# Patient Record
Sex: Male | Born: 1950 | Hispanic: No | Marital: Married | Smoking: Former smoker
Health system: Southern US, Community
[De-identification: ages and names within clinical notes are randomized; demographics above are authoritative.]

## PROBLEM LIST (undated history)

## (undated) DIAGNOSIS — K219 Gastro-esophageal reflux disease without esophagitis: Secondary | ICD-10-CM

## (undated) DIAGNOSIS — I10 Essential (primary) hypertension: Secondary | ICD-10-CM

## (undated) HISTORY — PX: ANAL FISTULOTOMY: SHX6423

## (undated) HISTORY — PX: OTHER SURGICAL HISTORY: SHX169

## (undated) HISTORY — PX: PROCTOSIGMOIDOSCOPY: SUR1052

## (undated) HISTORY — PX: FEMUR SURGERY: SHX943

---

## 2009-05-04 DIAGNOSIS — C4492 Squamous cell carcinoma of skin, unspecified: Secondary | ICD-10-CM

## 2009-05-04 HISTORY — DX: Squamous cell carcinoma of skin, unspecified: C44.92

## 2009-08-29 DIAGNOSIS — C4491 Basal cell carcinoma of skin, unspecified: Secondary | ICD-10-CM

## 2009-08-29 HISTORY — DX: Basal cell carcinoma of skin, unspecified: C44.91

## 2020-05-09 ENCOUNTER — Telehealth: Payer: Self-pay | Admitting: *Deleted

## 2020-05-09 DIAGNOSIS — Z122 Encounter for screening for malignant neoplasm of respiratory organs: Secondary | ICD-10-CM

## 2020-05-09 DIAGNOSIS — Z87891 Personal history of nicotine dependence: Secondary | ICD-10-CM

## 2020-05-09 NOTE — Telephone Encounter (Signed)
Received referral for initial lung cancer screening scan. Contacted patient and obtained smoking history,(former, quit 10/2012, 46 pack year) as well as answering questions related to screening process. Patient denies signs of lung cancer such as weight loss or hemoptysis. Patient denies comorbidity that would prevent curative treatment if lung cancer were found. Patient is scheduled for shared decision making visit and CT scan on 05/15/20 at 145pm.

## 2020-05-15 ENCOUNTER — Other Ambulatory Visit: Payer: Self-pay

## 2020-05-15 ENCOUNTER — Encounter: Payer: Self-pay | Admitting: Nurse Practitioner

## 2020-05-15 ENCOUNTER — Inpatient Hospital Stay: Payer: Medicare Other | Attending: Nurse Practitioner | Admitting: Nurse Practitioner

## 2020-05-15 ENCOUNTER — Ambulatory Visit
Admission: RE | Admit: 2020-05-15 | Discharge: 2020-05-15 | Disposition: A | Discharge status: Home / Self Care | Payer: Medicare Other | Source: Ambulatory Visit | Attending: Nurse Practitioner | Admitting: Nurse Practitioner

## 2020-05-15 DIAGNOSIS — Z87891 Personal history of nicotine dependence: Secondary | ICD-10-CM

## 2020-05-15 DIAGNOSIS — Z122 Encounter for screening for malignant neoplasm of respiratory organs: Secondary | ICD-10-CM

## 2020-05-15 NOTE — Progress Notes (Signed)
Virtual Visit via Video Enabled Telemedicine Note   I connected with Keith Barrera on 05/15/20 at 2:00 PM EST by video enabled telemedicine visit and verified that I am speaking with the correct person using two identifiers.   I discussed the limitations, risks, security and privacy concerns of performing an evaluation and management service by telemedicine and the availability of in-person appointments. I also discussed with the patient that there may be a patient responsible charge related to this service. The patient expressed understanding and agreed to proceed.   Other persons participating in the visit and their role in the encounter: Burgess Estelle, RN- checking in patient & navigation  Patient's location: [place]  Provider's location: Clinic  Chief Complaint: Low Dose CT Screening  Patient agreed to evaluation by telemedicine to discuss shared decision making for consideration of low dose CT lung cancer screening.    In accordance with CMS guidelines, patient has met eligibility criteria including age, absence of signs or symptoms of lung cancer.  Social History   Tobacco Use  . Smoking status: Former Smoker    Packs/day: 1.00    Years: 46.00    Pack years: 46.00    Types: Cigarettes    Quit date: 10/2012    Years since quitting: 7.5  Substance Use Topics  . Alcohol use: Not on file     A shared decision-making session was conducted prior to the performance of CT scan. This includes one or more decision aids, includes benefits and harms of screening, follow-up diagnostic testing, over-diagnosis, false positive rate, and total radiation exposure.   Counseling on the importance of adherence to annual lung cancer LDCT screening, impact of co-morbidities, and ability or willingness to undergo diagnosis and treatment is imperative for compliance of the program.   Counseling on the importance of continued smoking cessation for former smokers; the importance of smoking  cessation for current smokers, and information about tobacco cessation interventions have been given to patient including [place] and [facility] [place] programs.   Written order for lung cancer screening with LDCT has been given to the patient and any and all questions have been answered to the best of my abilities.    Yearly follow up will be coordinated by Burgess Estelle, Thoracic Navigator.  I discussed the assessment and treatment plan with the patient. The patient was provided an opportunity to ask questions and all were answered. The patient agreed with the plan and demonstrated an understanding of the instructions.   The patient was advised to call back or seek an in-person evaluation if the symptoms worsen or if the condition fails to improve as anticipated.   I provided 15 minutes of face-to-face video visit time during this encounter, and > 50% was spent counseling as documented under my assessment & plan.   Beckey Rutter, DNP, AGNP-C [place] at [facility] [phone] (clinic)

## 2020-05-18 ENCOUNTER — Encounter: Payer: Self-pay | Admitting: *Deleted

## 2020-10-09 ENCOUNTER — Other Ambulatory Visit: Payer: Self-pay

## 2020-10-09 ENCOUNTER — Ambulatory Visit (INDEPENDENT_AMBULATORY_CARE_PROVIDER_SITE_OTHER): Payer: Medicare Other | Admitting: Dermatology

## 2020-10-09 DIAGNOSIS — L820 Inflamed seborrheic keratosis: Secondary | ICD-10-CM | POA: Diagnosis not present

## 2020-10-09 DIAGNOSIS — D1800 Hemangioma unspecified site: Secondary | ICD-10-CM

## 2020-10-09 DIAGNOSIS — Z85828 Personal history of other malignant neoplasm of skin: Secondary | ICD-10-CM

## 2020-10-09 DIAGNOSIS — L219 Seborrheic dermatitis, unspecified: Secondary | ICD-10-CM | POA: Diagnosis not present

## 2020-10-09 DIAGNOSIS — L821 Other seborrheic keratosis: Secondary | ICD-10-CM

## 2020-10-09 DIAGNOSIS — C44212 Basal cell carcinoma of skin of right ear and external auricular canal: Secondary | ICD-10-CM | POA: Diagnosis not present

## 2020-10-09 DIAGNOSIS — Z1283 Encounter for screening for malignant neoplasm of skin: Secondary | ICD-10-CM

## 2020-10-09 DIAGNOSIS — L570 Actinic keratosis: Secondary | ICD-10-CM | POA: Diagnosis not present

## 2020-10-09 DIAGNOSIS — L814 Other melanin hyperpigmentation: Secondary | ICD-10-CM

## 2020-10-09 DIAGNOSIS — L308 Other specified dermatitis: Secondary | ICD-10-CM

## 2020-10-09 DIAGNOSIS — D229 Melanocytic nevi, unspecified: Secondary | ICD-10-CM

## 2020-10-09 DIAGNOSIS — D489 Neoplasm of uncertain behavior, unspecified: Secondary | ICD-10-CM

## 2020-10-09 DIAGNOSIS — L578 Other skin changes due to chronic exposure to nonionizing radiation: Secondary | ICD-10-CM

## 2020-10-09 MED ORDER — KETOCONAZOLE 2 % EX CREA: 1.0000 "application " | TOPICAL_CREAM | Freq: Two times a day (BID) | CUTANEOUS | 3 refills | Status: AC

## 2020-10-09 MED ORDER — CLOBETASOL PROPIONATE 0.05 % EX SOLN: 1.0000 "application " | Freq: Two times a day (BID) | CUTANEOUS | 1 refills | Status: DC

## 2020-10-09 NOTE — Progress Notes (Signed)
New Patient Visit  Subjective  Keith Barrera is a 70 y.o. male who presents for the following: New Patient (Initial Visit) (New patient here for initial visit and tbse. Patient states he has history of bcc on forehead. 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 Would like to discuss rash on chest and back since 2019, has a crusty spot on nose and right ear. Patient has history of dandruff. ).  Also itchy bump on back.  Patient here for full body skin exam and skin cancer screening. Patient has history of psoriasis.    Objective  Well appearing patient in no apparent distress; mood and affect are within normal limits.  A full examination was performed including scalp, head, eyes, ears, nose, lips, neck, chest, axillae, abdomen, back, buttocks, bilateral upper extremities, bilateral lower extremities, hands, feet, fingers, toes, fingernails, and toenails. All findings within normal limits unless otherwise noted below.  Objective  right anterior helix: 5 mm pink white flat papule      Objective  forehead x 9 , left ear helix x 1, (10): Erythematous thin papules/macules with gritty scale.   Objective  back, bilateral legs, bilateral arms: Scaly pink patches   Objective  left upper back x 1: Erythematous keratotic or waxy stuck-on papule   Objective  mid chest, perioccular area,: Bright pink patch with satellite papules lower sternum Pink scaly patches BL infraocular  Pink scaliness scalp  Assessment & Plan    Lentigines - Scattered tan macules - Discussed due to sun exposure - Benign, observe - Call for any changes  Seborrheic Keratoses - Stuck-on, waxy, tan-brown papules and plaques  - Discussed benign etiology and prognosis. - Observe - Call for any changes  Melanocytic Nevi - Tan-brown and/or pink-flesh-colored symmetric macules and papules - Benign appearing on exam today - Observation - Call clinic for new or changing moles - Recommend daily use of broad  spectrum spf 30+ sunscreen to sun-exposed areas.   Hemangiomas - Red papules - Discussed benign nature - Observe - Call for any changes  Actinic Damage On face  - Chronic, secondary to cumulative UV/sun exposure - diffuse scaly erythematous macules with underlying dyspigmentation - Recommend daily broad spectrum sunscreen SPF 30+ to sun-exposed areas, reapply every 2 hours as needed.  - Call for new or changing lesions.  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  - Call if any new or changing lesions are noted between office visits  History of Squamous Cell Carcinoma of the Skin - No evidence of recurrence today - No lymphadenopathy - Recommend regular full body skin exams - Recommend daily broad spectrum sunscreen SPF 30+ to sun-exposed areas, reapply every 2 hours as needed.  - Call if any new or changing lesions are noted between office visits  Skin cancer screening performed today.  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 Anesthesia: the lesion was anesthetized in a standard fashion   Anesthetic:  1% lidocaine w/ epinephrine 1-100,000 buffered w/ 8.4% NaHCO3 Instrument used: flexible razor blade   Hemostasis achieved with: pressure, aluminum chloride and electrodesiccation   Outcome: patient tolerated procedure well  Post-procedure details: wound care instructions given   Post-procedure details comment:  Ointment and small bandage applied  Specimen 1 - Surgical pathology Differential Diagnosis: r/o bcc  Check Margins: No 5 mm pink white flat papule  R/o bcc   Actinic keratosis  (10) forehead x 9 , left ear helix x 1,  Prior to procedure, discussed risks of blister formation, small wound, skin dyspigmentation, or rare scar following cryotherapy.  Destruction of lesion - forehead x 9 , left ear helix x 1,  Destruction method: cryotherapy   Informed consent: discussed and consent obtained   Lesion destroyed using liquid nitrogen: Yes   Region frozen until ice ball extended beyond lesion: Yes   Outcome: patient tolerated procedure well with no complications   Post-procedure details: wound care instructions given    Other eczema back, bilateral legs, bilateral arms  Nummular Dermatitis   Start Clobetasol mixed with cerave cream Eczema Skin Care  Buy TWO 16oz jars of CeraVe moisturizing cream  [facility], [facility], [facility] (no prescription needed)  Costs about $15 per jar   Jar #1: Use as a moisturizer as needed. Can be applied to any area of the body. Use twice daily to unaffected areas.  Jar #2: Pour one 56ml bottle of clobetasol 0.05% solution into jar, mix well. Label this jar to indicate the medication has been added. Use twice daily to affected areas. Do not apply to face, groin or underarms.  Moisturizer may burn or sting initially. Try for at least 4 weeks.  Ordered Medications: clobetasol (TEMOVATE) 0.05 % external solution  Inflamed seborrheic keratosis left upper back x 1  Prior to procedure, discussed risks of blister formation, small wound, skin dyspigmentation, or rare scar following cryotherapy.    Destruction of lesion - left upper back x 1  Destruction method: cryotherapy   Informed consent: discussed and consent obtained   Lesion destroyed using liquid nitrogen: Yes   Region frozen until ice ball extended beyond lesion: Yes   Outcome: patient tolerated procedure well with no complications   Post-procedure details: wound care instructions given    Seborrheic dermatitis mid chest, perioccular area,  Vs candida (chest)  Ketoconazole  2 % cream bid x 2-4 weeks. And prn flares H&S shampoo  Seborrheic Dermatitis  -  is a chronic persistent rash characterized by pinkness and scaling most commonly of the mid face but also can occur on the scalp (dandruff), ears; mid chest and mid back. It tends to be exacerbated by stress and cooler weather.  People who have neurologic disease may experience new onset or exacerbation of existing seborrheic dermatitis.  The condition is not curable but treatable and can be controlled.    Ordered Medications: ketoconazole (NIZORAL) 2 % cream  Return in about 6 weeks (around 11/20/2020) for rash follow up .  I, Ruthell Rummage, CMA, am acting as scribe for Brendolyn Patty, MD.  Documentation: I have reviewed the above documentation for accuracy and completeness, and I agree with the above.  Brendolyn Patty MD

## 2020-10-09 NOTE — Patient Instructions (Addendum)
Use for areas on back, legs and arms.   Clobetasol mixed with cerave cream Eczema Skin Care  Buy TWO 16oz jars of CeraVe moisturizing cream  [facility], [facility], [facility] (no prescription needed)  Costs about $15 per jar   Jar #1: Use as a moisturizer as needed. Can be applied to any area of the body. Use twice daily to unaffected areas.  Jar #2: Pour one 78ml bottle of clobetasol 0.05% solution into jar, mix well. Label this jar to indicate the medication has been added. Use twice daily to affected areas. Do not apply to face, groin or underarms.  Moisturizer may burn or sting initially. Try for at least 4 weeks.  Topical steroids (such as triamcinolone, fluocinolone, fluocinonide, mometasone, clobetasol, halobetasol, betamethasone, hydrocortisone) can cause thinning and lightening of the skin if they are used for too long in the same area. Your physician has selected the right strength medicine for your problem and area affected on the body. Please use your medication only as directed by your physician to prevent side effects.   Biopsy Wound Care Instructions  1. Leave the original bandage on for 24 hours if possible.  If the bandage becomes soaked or soiled before that time, it is OK to remove it and examine the wound.  A small amount of post-operative bleeding is normal.  If excessive bleeding occurs, remove the bandage, place gauze over the site and apply continuous pressure (no peeking) over the area for 30 minutes. If this does not work, please call our clinic as soon as possible or page your doctor if it is after hours.   2. Once a day, cleanse the wound with soap and water. It is fine to shower. If a thick crust develops you may use a Q-tip dipped into dilute hydrogen peroxide (mix 1:1 with water) to dissolve it.  Hydrogen peroxide can slow the healing process, so use it only as needed.    3. After washing, apply petroleum jelly (Vaseline) or an antibiotic ointment if your doctor prescribed  one for you, followed by a bandage.    4. For best healing, the wound should be covered with a layer of ointment at all times. If you are not able to keep the area covered with a bandage to hold the ointment in place, this may mean re-applying the ointment several times a day.  Continue this wound care until the wound has healed and is no longer open.   Itching and mild discomfort is normal during the healing process. However, if you develop pain or severe itching, please call our office.   If you have any discomfort, you can take Tylenol (acetaminophen) or ibuprofen as directed on the bottle. (Please do not take these if you have an allergy to them or cannot take them for another reason).  Some redness, tenderness and white or yellow material in the wound is normal healing.  If the area becomes very sore and red, or develops a thick yellow-green material (pus), it may be infected; please notify us.    If you have stitches, return to clinic as directed to have the stitches removed. You will continue wound care for 2-3 days after the stitches are removed.   Wound healing continues for up to one year following surgery. It is not unusual to experience pain in the scar from time to time during the interval.  If the pain becomes severe or the scar thickens, you should notify the office.    A slight amount of  redness in a scar is expected for the first six months.  After six months, the redness will fade and the scar will soften and fade.  The color difference becomes less noticeable with time.  If there are any problems, return for a post-op surgery check at your earliest convenience.  To improve the appearance of the scar, you can use silicone scar gel, cream, or sheets (such as Mederma or Serica) every night for up to one year. These are available over the counter (without a prescription).  Please call our office at [phone] for any questions or concerns.  Melanoma ABCDEs  Melanoma is the  most dangerous type of skin cancer, and is the leading cause of death from skin disease.  You are more likely to develop melanoma if you:  Have light-colored skin, light-colored eyes, or red or blond hair  Spend a lot of time in the sun  Tan regularly, either outdoors or in a tanning bed  Have had blistering sunburns, especially during childhood  Have a close family member who has had a melanoma  Have atypical moles or large birthmarks  Early detection of melanoma is key since treatment is typically straightforward and cure rates are extremely high if we catch it early.   The first sign of melanoma is often a change in a mole or a new dark spot.  The ABCDE system is a way of remembering the signs of melanoma.  A for asymmetry:  The two halves do not match. B for border:  The edges of the growth are irregular. C for color:  A mixture of colors are present instead of an even brown color. D for diameter:  Melanomas are usually (but not always) greater than 32mm - the size of a pencil eraser. E for evolution:  The spot keeps changing in size, shape, and color.  Please check your skin once per month between visits. You can use a small mirror in front and a large mirror behind you to keep an eye on the back side or your body.   If you see any new or changing lesions before your next follow-up, please call to schedule a visit.  Please continue daily skin protection including broad spectrum sunscreen SPF 30+ to sun-exposed areas, reapplying every 2 hours as needed when you're outdoors.    Cryotherapy Aftercare  . Wash gently with soap and water everyday.   Apply Vaseline and Band-Aid daily until healed.  Gentle Skin Care Guide  1. Bathe no more than once a day.  2. Avoid bathing in hot water  3. Use a mild soap like Dove, Vanicream, Cetaphil, CeraVe. Can use Lever 2000 or Cetaphil antibacterial soap  4. Use soap only where you need it. On most days, use it under your arms, between  your legs, and on your feet. Let the water rinse other areas unless visibly dirty.  5. When you get out of the bath/shower, use a towel to gently blot your skin dry, don't rub it.  6. While your skin is still a little damp, apply a moisturizing cream such as Vanicream, CeraVe, Cetaphil, Eucerin, Sarna lotion or plain Vaseline Jelly. For hands apply Neutrogena [place] Hand Cream or Excipial Hand Cream.  7. Reapply moisturizer any time you start to itch or feel dry.  8. Sometimes using free and clear laundry detergents can be helpful. Fabric softener sheets should be avoided. Downy Free & Gentle liquid, or any liquid fabric softener that is free of dyes and perfumes, it  acceptable to use  9. If your doctor has given you prescription creams you may apply moisturizers over them

## 2020-10-15 ENCOUNTER — Telehealth: Payer: Self-pay

## 2020-10-15 NOTE — Telephone Encounter (Signed)
Advised pt of bx results.  Scheduled pt for EDC/sh

## 2020-10-15 NOTE — Telephone Encounter (Signed)
-----   Message from Brendolyn Patty, MD sent at 10/13/2020  6:05 PM EST ----- Skin , right anterior helix BASAL CELL CARCINOMA, SUPERFICIAL, NODULAR AND INFILTRATIVE PATTERNS  BCC skin cancer- needs EDC

## 2020-10-31 ENCOUNTER — Other Ambulatory Visit: Payer: Self-pay

## 2020-10-31 ENCOUNTER — Ambulatory Visit (INDEPENDENT_AMBULATORY_CARE_PROVIDER_SITE_OTHER): Payer: Medicare Other | Admitting: Dermatology

## 2020-10-31 DIAGNOSIS — L219 Seborrheic dermatitis, unspecified: Secondary | ICD-10-CM | POA: Diagnosis not present

## 2020-10-31 DIAGNOSIS — L300 Nummular dermatitis: Secondary | ICD-10-CM | POA: Diagnosis not present

## 2020-10-31 DIAGNOSIS — L821 Other seborrheic keratosis: Secondary | ICD-10-CM

## 2020-10-31 DIAGNOSIS — C44212 Basal cell carcinoma of skin of right ear and external auricular canal: Secondary | ICD-10-CM | POA: Diagnosis not present

## 2020-10-31 NOTE — Progress Notes (Signed)
   Follow-Up Visit   Subjective  Keith Barrera is a 70 y.o. male who presents for the following: Basal Cell Carcinoma (Pt here for [facility] treatment for a biopsy proven BCC at the right anterior helix).  He also has questions about some of the cream treatments from his last visit.   The following portions of the chart were reviewed this encounter and updated as appropriate:      Review of Systems: No other skin or systemic complaints except as noted in HPI or Assessment and Plan.   Objective  Well appearing patient in no apparent distress; mood and affect are within normal limits.  A focused examination was performed including face, right ear, chest. Relevant physical exam findings are noted in the Assessment and Plan.  Objective  Right Anterior Helix: Biopsy proven BCC  Objective  Hairline and eyebrows: Pink scaly patches  Objective  right infraoccular: 5 mm pink flesh papule  Objective  right anterior axilla and lower sternal chest: Pink scaly patch  Assessment & Plan  Basal cell carcinoma (BCC) of skin of right ear Right Anterior Helix  Destruction of lesion  Destruction method: electrodesiccation and curettage   Informed consent: discussed and consent obtained   Timeout:  patient name, date of birth, surgical site, and procedure verified Procedure prep:  Patient was prepped and draped in usual sterile fashion Prep type:  Isopropyl alcohol Anesthesia: the lesion was anesthetized in a standard fashion   Anesthetic:  1% lidocaine w/ epinephrine 1-100,000 buffered w/ 8.4% NaHCO3 Curettage performed in three different directions: Yes   Electrodesiccation performed over the curetted area: Yes   Lesion length (cm):  0.5 Margin per side (cm):  0.2 Final wound size (cm):  0.9 Final wound size (cm) comment:  0.9 x 0.7 cm Hemostasis achieved with:  pressure, aluminum chloride and electrodesiccation Outcome: patient tolerated procedure well with no complications    Post-procedure details: wound care instructions given   Additional details:  Mupirocin ointment and Bandaid applied    Seborrheic dermatitis Hairline and eyebrows  Continue ketoconazole 2 % cream. Apply 1-2 times daily to affected areas.  Seborrheic Dermatitis  -  is a chronic persistent rash characterized by pinkness and scaling most commonly of the mid face but also can occur on the scalp (dandruff), ears; mid chest and mid back. It tends to be exacerbated by stress and cooler weather.  People who have neurologic disease may experience new onset or exacerbation of existing seborrheic dermatitis.  The condition is not curable but treatable and can be controlled.    Other Related Medications ketoconazole (NIZORAL) 2 % cream  Seborrheic keratosis right infraoccular  Benign-appearing.  Observation.  Call clinic for new or changing moles.  Recommend daily use of broad spectrum spf 30+ sunscreen to sun-exposed areas.   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   Once itchy rash clears, can switch to plain CeraVe cream.  Recommend mild soap and moisturizing cream 1-2 times daily.   . Return in 3 weeks (on 11/21/2020) for f/u as scheduled, may cancel if rash clears, then 6 months recheck ear.   I, Harriett Sine, CMA, am acting as scribe for Brendolyn Patty, MD.  Documentation: I have reviewed the above documentation for accuracy and completeness, and I agree with the above.  Brendolyn Patty MD

## 2020-10-31 NOTE — Patient Instructions (Signed)

## 2020-11-21 ENCOUNTER — Ambulatory Visit: Payer: Medicare Other | Admitting: Dermatology

## 2021-05-07 ENCOUNTER — Ambulatory Visit (INDEPENDENT_AMBULATORY_CARE_PROVIDER_SITE_OTHER): Payer: Medicare Other | Admitting: Dermatology

## 2021-05-07 ENCOUNTER — Other Ambulatory Visit: Payer: Self-pay

## 2021-05-07 DIAGNOSIS — Z85828 Personal history of other malignant neoplasm of skin: Secondary | ICD-10-CM | POA: Diagnosis not present

## 2021-05-07 DIAGNOSIS — L219 Seborrheic dermatitis, unspecified: Secondary | ICD-10-CM

## 2021-05-07 DIAGNOSIS — L821 Other seborrheic keratosis: Secondary | ICD-10-CM

## 2021-05-07 DIAGNOSIS — L820 Inflamed seborrheic keratosis: Secondary | ICD-10-CM

## 2021-05-07 DIAGNOSIS — Z1283 Encounter for screening for malignant neoplasm of skin: Secondary | ICD-10-CM

## 2021-05-07 DIAGNOSIS — L570 Actinic keratosis: Secondary | ICD-10-CM

## 2021-05-07 DIAGNOSIS — L578 Other skin changes due to chronic exposure to nonionizing radiation: Secondary | ICD-10-CM | POA: Diagnosis not present

## 2021-05-07 DIAGNOSIS — L281 Prurigo nodularis: Secondary | ICD-10-CM

## 2021-05-07 DIAGNOSIS — L300 Nummular dermatitis: Secondary | ICD-10-CM

## 2021-05-07 NOTE — Patient Instructions (Addendum)
Fluocinonide cream - Apply to itchy bump on neck below chin, spinal lower back, and left wrist 1-2 times a day until improved. Avoid face, groin, underarms.  Ketoconazole Cream - Apply to pink, flaky areas on face 1-2 times a day as needed.   Clobetasol/CeraVe mix - Apply to itchy rash on body 1-2 times a day as needed. Avoid face, groin, underarms.   Topical steroids (such as triamcinolone, fluocinolone, fluocinonide, mometasone, clobetasol, halobetasol, betamethasone, hydrocortisone) can cause thinning and lightening of the skin if they are used for too long in the same area. Your physician has selected the right strength medicine for your problem and area affected on the body. Please use your medication only as directed by your physician to prevent side effects.   Cryotherapy Aftercare  Wash gently with soap and water everyday.   Apply Vaseline and Band-Aid daily until healed.    If you have any questions or concerns for your doctor, please call our main line at [phone] and press option 4 to reach your doctor's medical assistant. If no one answers, please leave a voicemail as directed and we will return your call as soon as possible. Messages left after 4 pm will be answered the following business day.   You may also send [place] a message via [place]. We typically respond to MyChart messages within 1-2 business days.  For prescription refills, please ask your pharmacy to contact our office. Our fax number is [phone].  If you have an urgent issue when the clinic is closed that cannot wait until the next business day, you can page your doctor at the number below.    Please note that while we do our best to be available for urgent issues outside of office hours, we are not available 24/7.   If you have an urgent issue and are unable to reach [place], you may choose to seek medical care at your doctor's office, retail clinic, urgent care center, or emergency room.  If you have a medical  emergency, please immediately call 911 or go to the emergency department.  Pager Numbers  - Dr. Nehemiah Massed: [phone]  - Dr. Laurence Ferrari: [phone]  - Dr. Nicole Kindred: [phone]  In the event of inclement weather, please call our main line at [phone] for an update on the status of any delays or closures.  Dermatology Medication Tips: Please keep the boxes that topical medications come in in order to help keep track of the instructions about where and how to use these. Pharmacies typically print the medication instructions only on the boxes and not directly on the medication tubes.   If your medication is too expensive, please contact our office at [phone] option 4 or send [place] a message through [place].   We are unable to tell what your co-pay for medications will be in advance as this is different depending on your insurance coverage. However, we may be able to find a substitute medication at lower cost or fill out paperwork to get insurance to cover a needed medication.   If a prior authorization is required to get your medication covered by your insurance company, please allow [place] 1-2 business days to complete this process.  Drug prices often vary depending on where the prescription is filled and some pharmacies may offer cheaper prices.  The website www.goodrx.com contains coupons for medications through different pharmacies. The prices here do not account for what the cost may be with help from insurance (it may be cheaper with your insurance), but the  website can give you the price if you did not use any insurance.  - You can print the associated coupon and take it with your prescription to the pharmacy.  - You may also stop by our office during regular business hours and pick up a GoodRx coupon card.  - If you need your prescription sent electronically to a different pharmacy, notify our office through [facility] or by phone at [phone] option 4.

## 2021-05-07 NOTE — Progress Notes (Signed)
Follow-Up Visit   Subjective  Keith Barrera is a 70 y.o. male who presents for the following: Follow-up (Patient here for 6 month follow-up. He has a history of BCC of the right anterior helix that was treated 10/31/20. No new spots noticed since last visit. Also, recheck [facility] site of the right anterior helix, treated with [facility] 10/31/20.).  He has seb derm treated with ketoconazole cream and nummular derm treated with CeraVe/Clob mix, both under good control.   The following portions of the chart were reviewed this encounter and updated as appropriate:       Review of Systems:  No other skin or systemic complaints except as noted in HPI or Assessment and Plan.  Objective  Well appearing patient in no apparent distress; mood and affect are within normal limits.  A focused examination was performed including face, scalp, ears. Relevant physical exam findings are noted in the Assessment and Plan.  Right anterior helix Well healed scar with no evidence of recurrence.   ant neck/inf chin, L dorsal wrist, spinal lower back Light pink firm scaly papules.  R upper nasal dorsum x 1, R sideburn x 1, R malar cheek x 1, R lower cheek x 1, R frontal hairline x 1, L frontal hairline x 1 (6) Pink scaly macules.  face Pink patches with greasy scale.   Right Lateral Mid Back Erythematous keratotic or waxy stuck-on papule or plaque.   chest, ant axilla Clear today.   Assessment & Plan  Skin cancer screening performed today.  Actinic Damage - chronic, secondary to cumulative UV radiation exposure/sun exposure over time - diffuse scaly erythematous macules with underlying dyspigmentation - Recommend daily broad spectrum sunscreen SPF 30+ to sun-exposed areas, reapply every 2 hours as needed.  - Recommend staying in the shade or wearing long sleeves, sun glasses (UVA+UVB protection) and wide brim hats (4-inch brim around the entire circumference of the hat). - Call for new or changing  lesions.  History of Basal Cell Carcinoma of the Skin - No evidence of recurrence today - Recommend regular full body skin exams - Recommend daily broad spectrum sunscreen SPF 30+ to sun-exposed areas, reapply every 2 hours as needed.  - Call if any new or changing lesions are noted between office visits  History of Squamous Cell Carcinoma of the Skin - No evidence of recurrence today - Recommend regular full body skin exams - Recommend daily broad spectrum sunscreen SPF 30+ to sun-exposed areas, reapply every 2 hours as needed.  - Call if any new or changing lesions are noted between office visits  Seborrheic Keratoses - Stuck-on, waxy, tan-brown papules and/or plaques  - Benign-appearing - Discussed benign etiology and prognosis. - Observe - Call for any changes  History of basal cell carcinoma (BCC) Right anterior helix  Clear. Observe for recurrence. Call clinic for new or changing lesions.  Recommend regular skin exams, daily broad-spectrum spf 30+ sunscreen use, and photoprotection.    Prurigo nodularis ant neck/inf chin, L dorsal wrist, spinal lower back  Benign-appearing Start fluocinonide cream spot treat AA bid until improved. Pt has at home. Avoid face. Avoid picking.  Topical steroids (such as triamcinolone, fluocinolone, fluocinonide, mometasone, clobetasol, halobetasol, betamethasone, hydrocortisone) can cause thinning and lightening of the skin if they are used for too long in the same area. Your physician has selected the right strength medicine for your problem and area affected on the body. Please use your medication only as directed by your physician to prevent side effects.  AK (actinic keratosis) (6) R upper nasal dorsum x 1, R sideburn x 1, R malar cheek x 1, R lower cheek x 1, R frontal hairline x 1, L frontal hairline x 1  Actinic keratoses are precancerous spots that appear secondary to cumulative UV radiation exposure/sun exposure over time. They are  chronic with expected duration over 1 year. A portion of actinic keratoses will progress to squamous cell carcinoma of the skin. It is not possible to reliably predict which spots will progress to skin cancer and so treatment is recommended to prevent development of skin cancer.  Recommend daily broad spectrum sunscreen SPF 30+ to sun-exposed areas, reapply every 2 hours as needed.  Recommend staying in the shade or wearing long sleeves, sun glasses (UVA+UVB protection) and wide brim hats (4-inch brim around the entire circumference of the hat). Call for new or changing lesions.  Destruction of lesion - R upper nasal dorsum x 1, R sideburn x 1, R malar cheek x 1, R lower cheek x 1, R frontal hairline x 1, L frontal hairline x 1  Destruction method: cryotherapy   Informed consent: discussed and consent obtained   Lesion destroyed using liquid nitrogen: Yes   Region frozen until ice ball extended beyond lesion: Yes   Outcome: patient tolerated procedure well with no complications   Post-procedure details: wound care instructions given   Additional details:  Prior to procedure, discussed risks of blister formation, small wound, skin dyspigmentation, or rare scar following cryotherapy. Recommend Vaseline ointment to treated areas while healing.   Seborrheic dermatitis face  Seborrheic Dermatitis- improved -  is a chronic persistent rash characterized by pinkness and scaling most commonly of the mid face but also can occur on the scalp (dandruff), ears; mid chest, mid back and groin.  It tends to be exacerbated by stress and cooler weather.  People who have neurologic disease may experience new onset or exacerbation of existing seborrheic dermatitis.  The condition is not curable but treatable and can be controlled.  Continue ketoconazole 2% cream qd/bid prn.   Inflamed seborrheic keratosis Right Lateral Mid Back  Destruction of lesion - Right Lateral Mid Back  Destruction method:  cryotherapy   Informed consent: discussed and consent obtained   Lesion destroyed using liquid nitrogen: Yes   Region frozen until ice ball extended beyond lesion: Yes   Outcome: patient tolerated procedure well with no complications   Post-procedure details: wound care instructions given   Additional details:  Prior to procedure, discussed risks of blister formation, small wound, skin dyspigmentation, or rare scar following cryotherapy. Recommend Vaseline ointment to treated areas while healing.   Nummular dermatitis chest, ant axilla  Improved  Recommend mild soap and moisturizing cream (CeraVe) 1-2 times daily.   Continue Clobetasol/CeraVe mix qd/bid AAs prn flares.     Return in about 6 months (around 11/04/2021) for UBSE, AKs, h/o BCC.  IJamesetta Orleans, CMA, am acting as scribe for Brendolyn Patty, MD . Documentation: I have reviewed the above documentation for accuracy and completeness, and I agree with the above.  Brendolyn Patty MD

## 2021-05-09 ENCOUNTER — Other Ambulatory Visit: Payer: Self-pay | Admitting: Internal Medicine

## 2021-05-09 DIAGNOSIS — I129 Hypertensive chronic kidney disease with stage 1 through stage 4 chronic kidney disease, or unspecified chronic kidney disease: Secondary | ICD-10-CM

## 2021-05-09 DIAGNOSIS — N183 Chronic kidney disease, stage 3 unspecified: Secondary | ICD-10-CM

## 2021-05-22 ENCOUNTER — Other Ambulatory Visit: Payer: Self-pay

## 2021-05-22 ENCOUNTER — Ambulatory Visit
Admission: RE | Admit: 2021-05-22 | Discharge: 2021-05-22 | Disposition: A | Discharge status: Home / Self Care | Payer: Medicare Other | Source: Ambulatory Visit | Attending: Internal Medicine | Admitting: Internal Medicine

## 2021-05-22 DIAGNOSIS — N183 Chronic kidney disease, stage 3 unspecified: Secondary | ICD-10-CM | POA: Diagnosis present

## 2021-05-22 DIAGNOSIS — I129 Hypertensive chronic kidney disease with stage 1 through stage 4 chronic kidney disease, or unspecified chronic kidney disease: Secondary | ICD-10-CM | POA: Insufficient documentation

## 2021-05-24 ENCOUNTER — Telehealth: Payer: Self-pay | Admitting: Acute Care

## 2021-05-24 DIAGNOSIS — Z87891 Personal history of nicotine dependence: Secondary | ICD-10-CM

## 2021-05-24 NOTE — Telephone Encounter (Signed)
Spoke with pt and scheduled f/u low dose chest CT for 05/29/21 11:30. Pt verbalized understanding. Nothing further needed.

## 2021-05-29 ENCOUNTER — Other Ambulatory Visit: Payer: Self-pay

## 2021-05-29 ENCOUNTER — Ambulatory Visit
Admission: RE | Admit: 2021-05-29 | Discharge: 2021-05-29 | Disposition: A | Discharge status: Home / Self Care | Payer: Medicare Other | Source: Ambulatory Visit | Attending: Acute Care | Admitting: Acute Care

## 2021-05-29 DIAGNOSIS — Z87891 Personal history of nicotine dependence: Secondary | ICD-10-CM | POA: Insufficient documentation

## 2021-06-14 ENCOUNTER — Encounter: Payer: Self-pay | Admitting: *Deleted

## 2021-06-14 DIAGNOSIS — Z87891 Personal history of nicotine dependence: Secondary | ICD-10-CM

## 2021-10-21 ENCOUNTER — Other Ambulatory Visit: Payer: Self-pay

## 2021-10-21 ENCOUNTER — Ambulatory Visit (INDEPENDENT_AMBULATORY_CARE_PROVIDER_SITE_OTHER): Payer: Medicare Other | Admitting: Dermatology

## 2021-10-21 ENCOUNTER — Encounter: Payer: Self-pay | Admitting: Dermatology

## 2021-10-21 DIAGNOSIS — L814 Other melanin hyperpigmentation: Secondary | ICD-10-CM

## 2021-10-21 DIAGNOSIS — L820 Inflamed seborrheic keratosis: Secondary | ICD-10-CM

## 2021-10-21 DIAGNOSIS — L219 Seborrheic dermatitis, unspecified: Secondary | ICD-10-CM

## 2021-10-21 DIAGNOSIS — D1800 Hemangioma unspecified site: Secondary | ICD-10-CM

## 2021-10-21 DIAGNOSIS — L300 Nummular dermatitis: Secondary | ICD-10-CM | POA: Diagnosis not present

## 2021-10-21 DIAGNOSIS — L308 Other specified dermatitis: Secondary | ICD-10-CM | POA: Diagnosis not present

## 2021-10-21 DIAGNOSIS — L578 Other skin changes due to chronic exposure to nonionizing radiation: Secondary | ICD-10-CM

## 2021-10-21 DIAGNOSIS — L570 Actinic keratosis: Secondary | ICD-10-CM

## 2021-10-21 DIAGNOSIS — D229 Melanocytic nevi, unspecified: Secondary | ICD-10-CM

## 2021-10-21 DIAGNOSIS — L821 Other seborrheic keratosis: Secondary | ICD-10-CM

## 2021-10-21 DIAGNOSIS — Z1283 Encounter for screening for malignant neoplasm of skin: Secondary | ICD-10-CM | POA: Diagnosis not present

## 2021-10-21 DIAGNOSIS — Z85828 Personal history of other malignant neoplasm of skin: Secondary | ICD-10-CM

## 2021-10-21 MED ORDER — FLUOCINONIDE 0.05 % EX CREA: TOPICAL_CREAM | CUTANEOUS | 2 refills | Status: AC

## 2021-10-21 MED ORDER — CLOBETASOL PROPIONATE 0.05 % EX SOLN: 1.0000 "application " | Freq: Two times a day (BID) | CUTANEOUS | 1 refills | Status: AC

## 2021-10-21 NOTE — Patient Instructions (Addendum)
Cryotherapy Aftercare  Wash gently with soap and water everyday.   Apply Vaseline and Band-Aid daily until healed.   Prior to procedure, discussed risks of blister formation, small wound, skin dyspigmentation, or rare scar following cryotherapy. Recommend Vaseline ointment to treated areas while healing.    Fluocinonide cream spot treat areas on back once or twice a day as needed.   Eczema Skin Care  Buy TWO 16oz jars of CeraVe moisturizing cream  [facility], [facility], [facility] (no prescription needed)  Costs about $15 per jar   Jar #1: Use as a moisturizer as needed. Can be applied to any area of the body. Use twice daily to unaffected areas.  Jar #2: Pour one 78ml bottle of clobetasol 0.05% solution into jar, mix well. Label this jar to indicate the medication has been added. Use twice daily to affected areas. Do not apply to face, groin or underarms.  Moisturizer may burn or sting initially. Try for at least 4 weeks.    Gentle Skin Care Guide  1. Bathe no more than once a day.  2. Avoid bathing in hot water  3. Use a mild soap like Dove, Vanicream, Cetaphil, CeraVe. Can use Lever 2000 or Cetaphil antibacterial soap  4. Use soap only where you need it. On most days, use it under your arms, between your legs, and on your feet. Let the water rinse other areas unless visibly dirty.  5. When you get out of the bath/shower, use a towel to gently blot your skin dry, don't rub it.  6. While your skin is still a little damp, apply a moisturizing cream such as Vanicream, CeraVe, Cetaphil, Eucerin, Sarna lotion or plain Vaseline Jelly. For hands apply Neutrogena [place] Hand Cream or Excipial Hand Cream.  7. Reapply moisturizer any time you start to itch or feel dry.  8. Sometimes using free and clear laundry detergents can be helpful. Fabric softener sheets should be avoided. Downy Free & Gentle liquid, or any liquid fabric softener that is free of dyes and perfumes, it acceptable to  use  9. If your doctor has given you prescription creams you may apply moisturizers over them   If You Need Anything After Your Visit  If you have any questions or concerns for your doctor, please call our main line at [phone] and press option 4 to reach your doctor's medical assistant. If no one answers, please leave a voicemail as directed and we will return your call as soon as possible. Messages left after 4 pm will be answered the following business day.   You may also send [place] a message via [place]. We typically respond to MyChart messages within 1-2 business days.  For prescription refills, please ask your pharmacy to contact our office. Our fax number is [phone].  If you have an urgent issue when the clinic is closed that cannot wait until the next business day, you can page your doctor at the number below.    Please note that while we do our best to be available for urgent issues outside of office hours, we are not available 24/7.   If you have an urgent issue and are unable to reach [place], you may choose to seek medical care at your doctor's office, retail clinic, urgent care center, or emergency room.  If you have a medical emergency, please immediately call 911 or go to the emergency department.  Pager Numbers  - Dr. Nehemiah Massed: [phone]  - Dr. Laurence Ferrari: [phone]  - Dr. Nicole Kindred: [phone]  In the event of inclement weather, please call our main line at [phone] for an update on the status of any delays or closures.  Dermatology Medication Tips: Please keep the boxes that topical medications come in in order to help keep track of the instructions about where and how to use these. Pharmacies typically print the medication instructions only on the boxes and not directly on the medication tubes.   If your medication is too expensive, please contact our office at [phone] option 4 or send [place] a message through [place].   We are unable to tell what your  co-pay for medications will be in advance as this is different depending on your insurance coverage. However, we may be able to find a substitute medication at lower cost or fill out paperwork to get insurance to cover a needed medication.   If a prior authorization is required to get your medication covered by your insurance company, please allow [place] 1-2 business days to complete this process.  Drug prices often vary depending on where the prescription is filled and some pharmacies may offer cheaper prices.  The website www.goodrx.com contains coupons for medications through different pharmacies. The prices here do not account for what the cost may be with help from insurance (it may be cheaper with your insurance), but the website can give you the price if you did not use any insurance.  - You can print the associated coupon and take it with your prescription to the pharmacy.  - You may also stop by our office during regular business hours and pick up a GoodRx coupon card.  - If you need your prescription sent electronically to a different pharmacy, notify our office through [facility] or by phone at [phone] option 4.     Si Usted Necesita Algo Despus de Su Visita  Tambin puede enviarnos un mensaje a travs de Pharmacist, community. Por lo general respondemos a los mensajes de MyChart en el transcurso de 1 a 2 das hbiles.  Para renovar recetas, por favor pida a su farmacia que se ponga en contacto con nuestra oficina. Harland Dingwall de fax es [place] [phone].  Si tiene un asunto urgente cuando la clnica est cerrada y que no puede esperar hasta el siguiente da hbil, puede llamar/localizar a su doctor(a) al nmero que aparece a continuacin.   Por favor, tenga en cuenta que aunque hacemos todo lo posible para estar disponibles para asuntos urgentes fuera del horario de [place], no estamos disponibles las 24 horas del da, los 7 das de la [place].   Si tiene un problema urgente y no  puede comunicarse con nosotros, puede optar por buscar atencin mdica  en el consultorio de su doctor(a), en una clnica privada, en un centro de atencin urgente o en una sala de emergencias.  Si tiene Engineering geologist, por favor llame inmediatamente al 911 o vaya a la sala de emergencias.  Nmeros de bper  - Dr. Nehemiah Massed: [phone]  - Dra. Moye: [phone]  - Dra. Nicole Kindred: [phone]  En caso de inclemencias del [place], por favor llame a Johnsie Kindred principal al [phone] para una actualizacin sobre el [place] de cualquier retraso o cierre.  Consejos para la medicacin en dermatologa: Por favor, guarde las cajas en las que vienen los medicamentos de uso tpico para ayudarle a seguir las instrucciones sobre dnde y cmo usarlos. Las farmacias generalmente imprimen las instrucciones del medicamento slo en las cajas y no directamente en los tubos del [place].  Si su medicamento es muy caro, por favor, pngase en contacto con Zigmund Daniel llamando al [phone] y presione la opcin 4 o envenos un mensaje a travs de Pharmacist, community.   No podemos decirle cul ser su copago por los medicamentos por adelantado ya que esto es diferente dependiendo de la cobertura de su seguro. Sin embargo, es posible que podamos encontrar un medicamento sustituto a Electrical engineer un formulario para que el seguro cubra el medicamento que se considera necesario.   Si se requiere una autorizacin previa para que su compaa de seguros [place] su medicamento, por favor permtanos de 1 a 2 das hbiles para completar este proceso.  Los precios de los medicamentos varan con frecuencia dependiendo del Environmental consultant de dnde se surte la receta y alguna farmacias pueden ofrecer precios ms baratos.  El sitio web www.goodrx.com tiene cupones para medicamentos de Airline pilot. Los precios aqu no tienen en cuenta lo que podra costar con la ayuda del seguro (puede ser ms barato con su seguro),  pero el sitio web puede darle el precio si no utiliz Research scientist (physical sciences).  - Puede imprimir el cupn correspondiente y llevarlo con su receta a la farmacia.  - Tambin puede pasar por nuestra oficina durante el horario de atencin regular y Charity fundraiser una tarjeta de cupones de GoodRx.  - Si necesita que su receta se enve electrnicamente a una farmacia diferente, informe a nuestra oficina a travs de MyChart de [place] o por telfono llamando al [phone] y presione la opcin 4.

## 2021-10-21 NOTE — Progress Notes (Signed)
Follow-Up Visit   Subjective  Keith Barrera is a 71 y.o. male who presents for the following: Annual Exam (Here for skin cancer screening. Upper body. Hx BCC's, SCC's, AK's. Concerned with area on left ear) and Eczema (Hx of eczema on arms and legs. Dur: years. Would like refill of Fluocinonide cream, uses as needed).  The patient presents for Upper Body Skin Exam (UBSE) for skin cancer screening and mole check.  The patient has spots, moles and lesions to be evaluated, some may be new or changing and the patient has concerns that these could be cancer.   The following portions of the chart were reviewed this encounter and updated as appropriate:      Review of Systems: No other skin or systemic complaints except as noted in HPI or Assessment and Plan.   Objective  Well appearing patient in no apparent distress; mood and affect are within normal limits.  All skin waist up examined.  Left Upper Back, right lower back Scattered small scaly pink papules  Left Wrist - dorsal, right wrist dorsal Pink scaly firm nodules  Left Antihelix x1, left temple x1, left nasal dorsum x2, right eyebrow x1, left forehead x1 (6) Erythematous thin papules/macules with gritty scale.   face Pink scaliness   Assessment & Plan   Lentigines - Scattered tan macules - Due to sun exposure - Benign-appearing, observe - Recommend daily broad spectrum sunscreen SPF 30+ to sun-exposed areas, reapply every 2 hours as needed. - Call for any changes  Seborrheic Keratoses - Stuck-on, waxy, tan-brown papules and/or plaques  - Benign-appearing - Discussed benign etiology and prognosis. - Observe - Call for any changes  Melanocytic Nevi - Tan-brown and/or pink-flesh-colored symmetric macules and papules - Benign appearing on exam today - Observation - Call clinic for new or changing moles - Recommend daily use of broad spectrum spf 30+ sunscreen to sun-exposed areas.   Hemangiomas - Red papules -  Discussed benign nature - Observe - Call for any changes  Actinic Damage - Chronic condition, secondary to cumulative UV/sun exposure - diffuse scaly erythematous macules with underlying dyspigmentation - Recommend daily broad spectrum sunscreen SPF 30+ to sun-exposed areas, reapply every 2 hours as needed.  - Staying in the shade or wearing long sleeves, sun glasses (UVA+UVB protection) and wide brim hats (4-inch brim around the entire circumference of the hat) are also recommended for sun protection.  - Call for new or changing lesions.  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  - Call if any new or changing lesions are noted between office visits   History of Squamous Cell Carcinoma of the Skin - No evidence of recurrence today  - Recommend regular full body skin exams - Recommend daily broad spectrum sunscreen SPF 30+ to sun-exposed areas, reapply every 2 hours as needed.  - Call if any new or changing lesions are noted between office visits    Skin cancer screening performed today.  Nummular dermatitis Left Upper Back, right lower back  Chronic and persistent condition with duration or expected duration over one year. Condition is bothersome/symptomatic for patient. Overall improved but mild flare today  Fluocinonide cream spot treat areas on back once or twice a day as needed.  Continue Clob/CeraVe  mix qd/bid prn flares to body, avoid f/g/a  Eczema Skin Care  Buy TWO 16oz jars of CeraVe moisturizing cream  [facility], [facility], [facility] (no prescription needed)  Costs about $15 per jar   Jar #1: Use as a moisturizer as needed. Can be applied to any area of the body. Use twice daily to unaffected areas.  Jar #2: Pour  one 68ml bottle of clobetasol 0.05% solution into jar, mix well. Label this jar to indicate the medication has been added. Use twice daily to affected areas. Do not apply to face, groin or underarms.  Moisturizer may burn or sting initially. Try for at least 4 weeks.    Related Medications fluocinonide cream (LIDEX) 0.05 % Spot treat affected areas on back once or twice daily as needed for itching. Avoid applying to face, groin, and axilla. Use as directed. Long-term use can cause thinning of the skin.  Other eczema  Related Medications clobetasol (TEMOVATE) 0.05 % external solution Apply 1 application topically 2 (two) times daily. Apply to back, legs, and arms. Avoid applying to face, groin, and axilla. Use as directed. Risk of skin atrophy with long-term use reviewed.  Inflamed seborrheic keratosis Left Wrist - dorsal, right wrist dorsal  Vs Prurigo Nodules  Use Fluocinonide cream twice daily as needed  Avoid picking  Recheck on f/up, recommend cryotherapy if not improved  AK (actinic keratosis) (6) Left Antihelix x1, left temple x1, left nasal dorsum x2, right eyebrow x1, left forehead x1  VS ISK  Recheck L antihelix on f/up  Actinic keratoses are precancerous spots that appear secondary to cumulative UV radiation exposure/sun exposure over time. They are chronic with expected duration over 1 year. A portion of actinic keratoses will progress to squamous cell carcinoma of the skin. It is not possible to reliably predict which spots will progress to skin cancer and so treatment is recommended to prevent development of skin cancer.  Recommend daily broad spectrum sunscreen SPF 30+ to sun-exposed areas, reapply every 2 hours as needed.  Recommend staying in the shade or wearing long sleeves, sun glasses (UVA+UVB protection) and wide brim hats (4-inch brim around the entire circumference of the hat). Call for new or changing lesions.  Destruction of lesion - Left Antihelix x1,  left temple x1, left nasal dorsum x2, right eyebrow x1, left forehead x1  Destruction method: cryotherapy   Informed consent: discussed and consent obtained   Lesion destroyed using liquid nitrogen: Yes   Region frozen until ice ball extended beyond lesion: Yes   Outcome: patient tolerated procedure well with no complications   Post-procedure details: wound care instructions given   Additional details:  Prior to procedure, discussed risks of blister formation, small wound, skin dyspigmentation, or rare scar following cryotherapy. Recommend Vaseline ointment to treated areas while healing.   Seborrheic dermatitis face  Chronic condition with duration or expected duration over one year. Currently well-controlled.   Continue Ketoconazole cream as directed. Call for refills.  Seborrheic Dermatitis  -  is a chronic persistent rash characterized by pinkness and scaling most commonly of the mid face but also can occur on the scalp (dandruff), ears; mid chest, mid back and groin.  It tends to be exacerbated by stress and cooler weather.  People who have neurologic disease may experience new onset or exacerbation of existing seborrheic dermatitis.  The condition is not curable but treatable and can be controlled.    Return in about 6 months (around 04/20/2022) for AK Follow Up, UBSE.  I,  Emelia Salisbury, CMA, am acting as scribe for Brendolyn Patty, MD.  Documentation: I have reviewed the above documentation for accuracy and completeness, and I agree with the above.  Brendolyn Patty MD

## 2021-11-11 ENCOUNTER — Ambulatory Visit: Payer: Medicare Other | Admitting: Dermatology

## 2022-04-29 ENCOUNTER — Ambulatory Visit (INDEPENDENT_AMBULATORY_CARE_PROVIDER_SITE_OTHER): Payer: Medicare Other | Admitting: Dermatology

## 2022-04-29 DIAGNOSIS — D1800 Hemangioma unspecified site: Secondary | ICD-10-CM

## 2022-04-29 DIAGNOSIS — L309 Dermatitis, unspecified: Secondary | ICD-10-CM

## 2022-04-29 DIAGNOSIS — L578 Other skin changes due to chronic exposure to nonionizing radiation: Secondary | ICD-10-CM

## 2022-04-29 DIAGNOSIS — L219 Seborrheic dermatitis, unspecified: Secondary | ICD-10-CM | POA: Diagnosis not present

## 2022-04-29 DIAGNOSIS — L821 Other seborrheic keratosis: Secondary | ICD-10-CM

## 2022-04-29 DIAGNOSIS — Z85828 Personal history of other malignant neoplasm of skin: Secondary | ICD-10-CM

## 2022-04-29 DIAGNOSIS — L820 Inflamed seborrheic keratosis: Secondary | ICD-10-CM | POA: Diagnosis not present

## 2022-04-29 DIAGNOSIS — L570 Actinic keratosis: Secondary | ICD-10-CM

## 2022-04-29 DIAGNOSIS — L300 Nummular dermatitis: Secondary | ICD-10-CM

## 2022-04-29 DIAGNOSIS — Z1283 Encounter for screening for malignant neoplasm of skin: Secondary | ICD-10-CM

## 2022-04-29 DIAGNOSIS — L814 Other melanin hyperpigmentation: Secondary | ICD-10-CM

## 2022-04-29 DIAGNOSIS — D229 Melanocytic nevi, unspecified: Secondary | ICD-10-CM

## 2022-04-29 MED ORDER — KETOCONAZOLE 2 % EX SHAM: 1.0000 | MEDICATED_SHAMPOO | CUTANEOUS | 11 refills | Status: DC

## 2022-04-29 NOTE — Patient Instructions (Addendum)
For dryness and cracked skin on left foot  Continue using antibiotic topical on any open areas daily until healed  Once open areas have healed can use flucinonide cream - apply topically daily to 2 times daily to dry areas of foot  Recommend starting moisturizer with exfoliant (Urea, Salicylic acid, or Lactic acid) one to two times daily to help smooth rough and bumpy skin.  OTC options include Cetaphil Rough and Bumpy lotion (Urea), Eucerin Roughness Relief lotion or spot treatment cream (Urea), CeraVe SA lotion/cream for Rough and Bumpy skin (Sal Acid), Gold Bond Rough and Bumpy cream (Sal Acid), and AmLactin 12% lotion/cream (Lactic Acid).  If applying in morning, also apply sunscreen to sun-exposed areas, since these exfoliating moisturizers can increase sensitivity to sun.   For rough scaly areas on left wrist and right upper elbow at other rough bumps at right arm near elbow   Continue Flucinonide cream to affected areas daily as needed.  Avoid picking at areas   For Seborrheic Dermatitis   At chest and body  can use samples of Zoryve topically to areas daily as needed. Continue ketoconazole shampoo as needed  Continue Ketoconazole cream as needed at face eyebrows when you run out of samples of Zoryve     Recommend For Precancer spots at Face    Photodynamic Therapy/Blue Light Therapy  Actinic keratoses are the dry, red scaly spots on the skin caused by sun damage. A portion of these spots can turn into skin cancer with time, and treating them can help prevent development of skin cancer.   Treatment of these spots requires removal of the defective skin cells. There are various ways to remove actinic keratoses, including freezing with liquid nitrogen, treatment with creams, or treatment with a blue light procedure in the office.   Photodynamic Therapy (PDT), also known as "blue light therapy" is an in office procedure used to treat actinic keratoses. It works by targeting  precancerous cells. After treatment, these cells peel off and are replaced by healthy ones.   For your phototherapy appointment, you will have two appointments on the day of your treatment. The first appointment will be to apply a cream to the treatment area. You will leave this cream on for 1-2 hours depending on the area being treated. The second appointment will be to shine a blue light on the area for 16 minutes to kill off the precancer cells. It is common to experience a burning sensation during the treatment.  After your treatment, it will be important to keep the treated areas of skin out of the sun completely for 48-72 hours (2-3 days) to prevent having a reaction.   Common side effects include: - Burning or stinging, which may be severe and can last up to 24-72 hours after your treatment - Scaling and crusting which may last up to 2 weeks - Redness, swelling and/or peeling which can last up to 4 weeks  To Care for Your Skin After PDT/Blue Light Therapy: - Wash with soap, water and shampoo as normal. - If needed, you can use cold compresses (e.g. ice packs) for comfort - If okay with your primary care doctor, you may use analgesics such as acetaminophen (tylenol) every 4-6 hours, not to exceed recommended dose - You may apply Cerave Healing Ointment, Vaseline or Aquaphor as needed - If you have a lot of swelling you may take a Benadryl to help with this (this may cause drowsiness), not to exceed recommended dose. This may increase the  risk of falls in people over 65 and may slow reaction time while driving, so it is not recommended to take before driving or operating machinery. - Sun Precautions - Wear a wide brim hat for the next week if outside  - Wear a sunblock with zinc or titanium dioxide at least SPF 50 daily  If you have any questions or concerns, please call the office and ask to speak with a nurse.    --------------------------------------------------------------------------------------------------------------    Actinic keratoses are precancerous spots that appear secondary to cumulative UV radiation exposure/sun exposure over time. They are chronic with expected duration over 1 year. A portion of actinic keratoses will progress to squamous cell carcinoma of the skin. It is not possible to reliably predict which spots will progress to skin cancer and so treatment is recommended to prevent development of skin cancer.  Recommend daily broad spectrum sunscreen SPF 30+ to sun-exposed areas, reapply every 2 hours as needed.  Recommend staying in the shade or wearing long sleeves, sun glasses (UVA+UVB protection) and wide brim hats (4-inch brim around the entire circumference of the hat). Call for new or changing lesions.   Cryotherapy Aftercare  Wash gently with soap and water everyday.   Apply Vaseline and Band-Aid daily until healed.   Seborrheic Keratosis  What causes seborrheic keratoses? Seborrheic keratoses are harmless, common skin growths that first appear during adult life.  As time goes by, more growths appear.  Some people may develop a large number of them.  Seborrheic keratoses appear on both covered and uncovered body parts.  They are not caused by sunlight.  The tendency to develop seborrheic keratoses can be inherited.  They vary in color from skin-colored to gray, brown, or even black.  They can be either smooth or have a rough, warty surface.   Seborrheic keratoses are superficial and look as if they were stuck on the skin.  Under the microscope this type of keratosis looks like layers upon layers of skin.  That is why at times the top layer may seem to fall off, but the rest of the growth remains and re-grows.    Treatment Seborrheic keratoses do not need to be treated, but can easily be removed in the office.  Seborrheic keratoses often cause symptoms when they rub on  clothing or jewelry.  Lesions can be in the way of shaving.  If they become inflamed, they can cause itching, soreness, or burning.  Removal of a seborrheic keratosis can be accomplished by freezing, burning, or surgery. If any spot bleeds, scabs, or grows rapidly, please return to have it checked, as these can be an indication of a skin cancer.       Melanoma ABCDEs  Melanoma is the most dangerous type of skin cancer, and is the leading cause of death from skin disease.  You are more likely to develop melanoma if you: Have light-colored skin, light-colored eyes, or red or blond hair Spend a lot of time in the sun Tan regularly, either outdoors or in a tanning bed Have had blistering sunburns, especially during childhood Have a close family member who has had a melanoma Have atypical moles or large birthmarks  Early detection of melanoma is key since treatment is typically straightforward and cure rates are extremely high if we catch it early.   The first sign of melanoma is often a change in a mole or a new dark spot.  The ABCDE system is a way of remembering the signs of melanoma.  A for asymmetry:  The two halves do not match. B for border:  The edges of the growth are irregular. C for color:  A mixture of colors are present instead of an even brown color. D for diameter:  Melanomas are usually (but not always) greater than 71m - the size of a pencil eraser. E for evolution:  The spot keeps changing in size, shape, and color.  Please check your skin once per month between visits. You can use a small mirror in front and a large mirror behind you to keep an eye on the back side or your body.   If you see any new or changing lesions before your next follow-up, please call to schedule a visit.  Please continue daily skin protection including broad spectrum sunscreen SPF 30+ to sun-exposed areas, reapplying every 2 hours as needed when you're outdoors.   Staying in the shade or  wearing long sleeves, sun glasses (UVA+UVB protection) and wide brim hats (4-inch brim around the entire circumference of the hat) are also recommended for sun protection.    Due to recent changes in healthcare laws, you may see results of your pathology and/or laboratory studies on MyChart before the doctors have had a chance to review them. We understand that in some cases there may be results that are confusing or concerning to you. Please understand that not all results are received at the same time and often the doctors may need to interpret multiple results in order to provide you with the best plan of care or course of treatment. Therefore, we ask that you please give u[place]2 business days to thoroughly review all your results before contacting the office for clarification. Should we see a critical lab result, you will be contacted sooner.   If You Need Anything After Your Visit  If you have any questions or concerns for your doctor, please call our main line at [phone] press option 4 to reach your doctor's medical assistant. If no one answers, please leave a voicemail as directed and we will return your call as soon as possible. Messages left after 4 pm will be answered the following business day.   You may also send [place] message via [place] We typically respond to MyChart messages within 1-2 business days.  For prescription refills, please ask your pharmacy to contact our office. Our fax number is [phone]  If you have an urgent issue when the clinic is closed that cannot wait until the next business day, you can page your doctor at the number below.    Please note that while we do our best to be available for urgent issues outside of office hours, we are not available 24/7.   If you have an urgent issue and are unable to reach u[place] you may choose to seek medical care at your doctor's office, retail clinic, urgent care center, or emergency room.  If you have a medical  emergency, please immediately call 911 or go to the emergency department.  Pager Numbers  - Dr. KNehemiah Massed [phone] - Dr. MLaurence Ferrari [phone] - Dr. SNicole Kindred [phone] In the event of inclement weather, please call our main line at [phone] an update on the status of any delays or closures.  Dermatology Medication Tips: Please keep the boxes that topical medications come in in order to help keep track of the instructions about where and how to use these. Pharmacies typically print the medication instructions only on the boxes and  not directly on the medication tubes.   If your medication is too expensive, please contact our office at [phone] option 4 or send [place] a message through [place].   We are unable to tell what your co-pay for medications will be in advance as this is different depending on your insurance coverage. However, we may be able to find a substitute medication at lower cost or fill out paperwork to get insurance to cover a needed medication.   If a prior authorization is required to get your medication covered by your insurance company, please allow [place] 1-2 business days to complete this process.  Drug prices often vary depending on where the prescription is filled and some pharmacies may offer cheaper prices.  The website www.goodrx.com contains coupons for medications through different pharmacies. The prices here do not account for what the cost may be with help from insurance (it may be cheaper with your insurance), but the website can give you the price if you did not use any insurance.  - You can print the associated coupon and take it with your prescription to the pharmacy.  - You may also stop by our office during regular business hours and pick up a GoodRx coupon card.  - If you need your prescription sent electronically to a different pharmacy, notify our office through [facility] or by phone at [phone] option 4.     Si Usted  Necesita Algo Despus de Su Visita  Tambin puede enviarnos un mensaje a travs de Pharmacist, community. Por lo general respondemos a los mensajes de MyChart en el transcurso de 1 a 2 das hbiles.  Para renovar recetas, por favor pida a su farmacia que se ponga en contacto con nuestra oficina. Harland Dingwall de fax es [place] [phone].  Si tiene un asunto urgente cuando la clnica est cerrada y que no puede esperar hasta el siguiente da hbil, puede llamar/localizar a su doctor(a) al nmero que aparece a continuacin.   Por favor, tenga en cuenta que aunque hacemos todo lo posible para estar disponibles para asuntos urgentes fuera del horario de [place], no estamos disponibles las 24 horas del da, los 7 das de la [place].   Si tiene un problema urgente y no puede comunicarse con nosotros, puede optar por buscar atencin mdica  en el consultorio de su doctor(a), en una clnica privada, en un centro de atencin urgente o en una sala de emergencias.  Si tiene Engineering geologist, por favor llame inmediatamente al 911 o vaya a la sala de emergencias.  Nmeros de bper  - Dr. Nehemiah Massed: [phone]  - Dra. Moye: [phone]  - Dra. Nicole Kindred: [phone]  En caso de inclemencias del [place], por favor llame a Johnsie Kindred principal al [phone] para una actualizacin sobre el [place] de cualquier retraso o cierre.  Consejos para la medicacin en dermatologa: Por favor, guarde las cajas en las que vienen los medicamentos de uso tpico para ayudarle a seguir las instrucciones sobre dnde y cmo usarlos. Las farmacias generalmente imprimen las instrucciones del medicamento slo en las cajas y no directamente en los tubos del [place].   Si su medicamento es muy caro, por favor, pngase en contacto con Zigmund Daniel llamando al [phone] y presione la opcin 4 o envenos un mensaje a travs de Pharmacist, community.   No podemos decirle cul ser su copago por los medicamentos por adelantado ya que esto es  diferente dependiendo de la cobertura de su seguro. Sin embargo, es posible que podamos encontrar un  medicamento sustituto a Electrical engineer un formulario para que el seguro cubra el medicamento que se considera necesario.   Si se requiere una autorizacin previa para que su compaa de seguros [place] su medicamento, por favor permtanos de 1 a 2 das hbiles para completar este proceso.  Los precios de los medicamentos varan con frecuencia dependiendo del Environmental consultant de dnde se surte la receta y alguna farmacias pueden ofrecer precios ms baratos.  El sitio web www.goodrx.com tiene cupones para medicamentos de Airline pilot. Los precios aqu no tienen en cuenta lo que podra costar con la ayuda del seguro (puede ser ms barato con su seguro), pero el sitio web puede darle el precio si no utiliz Research scientist (physical sciences).  - Puede imprimir el cupn correspondiente y llevarlo con su receta a la farmacia.  - Tambin puede pasar por nuestra oficina durante el horario de atencin regular y Charity fundraiser una tarjeta de cupones de GoodRx.  - Si necesita que su receta se enve electrnicamente a una farmacia diferente, informe a nuestra oficina a travs de MyChart de [place] o por telfono llamando al [phone] y presione la opcin 4.

## 2022-04-29 NOTE — Progress Notes (Signed)
Follow-Up Visit   Subjective  Keith Barrera is a 71 y.o. 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 Patient reports some dry cracked skin at left foot he would like to discuss treatment options. Itchy scaly spot at chest. ).  The patient presents for Total-Body Skin Exam (TBSE) for skin cancer screening and mole check.  The patient has spots, moles and lesions to be evaluated, some may be new or changing and the patient has concerns that these could be cancer   The following portions of the chart were reviewed this encounter and updated as appropriate:      Review of Systems: No other skin or systemic complaints except as noted in HPI or Assessment and Plan.   Objective  Well appearing patient in no apparent distress; mood and affect are within normal limits.  All skin waist up examined. Also examined left foot   scalp, face, chest, eyebrows Pink scaly patch with few pink papules with telangiectasia at chest     Left Upper Back, right lower back Back, chest clear  left wrist, right upper arm near elbow (2), spinal lower back x 1 , right lower back x 1 (2) Erythematous stuck-on, waxy papules back,  firm pink scaly papules arms  Rt medial cheek x 3, right paranasal x 1, left temple x 1, left preauricular x 1, left antihelix x 1, nasal dorum x 2, right nasal dorsum x 1, right cheek x 2, right temple x 1, right upper forehead x 8, left forehead x 2 (23) Erythematous thin papules/macules with gritty scale.   And eroded macule on left helix- pt stated just peeled yesterday, but is overall much improved  left plantar foot Crusted scaly patches on left plantar foot    Assessment & Plan  Seborrheic dermatitis scalp, face, chest, eyebrows  Chronic and persistent condition with duration or expected duration over one year.  Condition is bothersome/symptomatic for patient. Currently flared.  Seborrheic Dermatitis  -  is a chronic persistent rash characterized by pinkness and scaling most commonly of the mid face but also can occur on the scalp (dandruff), ears; mid chest, mid back and groin.  It tends to be exacerbated by stress and cooler weather.  People who have neurologic disease may experience new onset or exacerbation of existing seborrheic dermatitis.  The condition is not curable but treatable and can be controlled.  Start Zoryve cream apply topically daily as needed at chest and other areas of body  Samples given   Continue when run out of sample of Zoryve- ketoconazole 2 % cream. Apply 1-2 times daily to affected areas   Continue Ketoconazole shampoo as needed for flares  D/c TMC cream due to risk skin atrophy with daily use  Related Medications ketoconazole (NIZORAL) 2 % shampoo Apply 1 Application topically 2 (two) times a week. apply three times per week, massage into scalp and leave in for 10 minutes before rinsing out  Nummular dermatitis Left Upper Back, right lower back  Chronic condition with duration or expected duration over one year. Currently well-controlled.    Continue Fluocinonide cream spot treat areas on back once or twice a day as needed.  Continue Clob/CeraVe mix qd/bid prn flares to body, avoid f/g/a  Recommend mild soap and moisturizing cream 1-2 times daily.  Gentle  skin care handout provided.     Related Medications fluocinonide cream (LIDEX) 0.05 % Spot treat affected areas on back once or twice daily as needed for itching. Avoid applying to face, groin, and axilla. Use as directed. Long-term use can cause thinning of the skin.  Inflamed seborrheic keratosis (4) left wrist, right upper arm near elbow (2); spinal lower back x 1 , right lower back x 1 (2)  Symptomatic, irritating, patient would like treated- spinal lower back, right lower back   Vs Prurigo Nodules- At  left wrist, right upper elbow. Continue Fluocinonide cream twice daily as needed.  Avoid picking  Destruction of lesion - spinal lower back x 1 , right lower back x 1  Destruction method: cryotherapy   Informed consent: discussed and consent obtained   Lesion destroyed using liquid nitrogen: Yes   Region frozen until ice ball extended beyond lesion: Yes   Outcome: patient tolerated procedure well with no complications   Post-procedure details: wound care instructions given   Additional details:  Prior to procedure, discussed risks of blister formation, small wound, skin dyspigmentation, or rare scar following cryotherapy. Recommend Vaseline ointment to treated areas while healing.   Actinic keratosis (23) Rt medial cheek x 3, right paranasal x 1, left temple x 1, left preauricular x 1, left antihelix x 1, nasal dorum x 2, right nasal dorsum x 1, right cheek x 2, right temple x 1, right upper forehead x 8, left forehead x 2  Discussed PDT for face, information given in handout   Recheck left antihelix at next follow up (2nd Ln2 today), if not clear may consider biopsy    Actinic keratoses are precancerous spots that appear secondary to cumulative UV radiation exposure/sun exposure over time. They are chronic with expected duration over 1 year. A portion of actinic keratoses will progress to squamous cell carcinoma of the skin. It is not possible to reliably predict which spots will progress to skin cancer and so treatment is recommended to prevent development of skin cancer.  Recommend daily broad spectrum sunscreen SPF 30+ to sun-exposed areas, reapply every 2 hours as needed.  Recommend staying in the shade or wearing long sleeves, sun glasses (UVA+UVB protection) and wide brim hats (4-inch brim around the entire circumference of the hat). Call for new or changing lesions.  Destruction of lesion - Rt medial cheek x 3, right paranasal x 1, left temple x 1, left preauricular x 1, left  antihelix x 1, nasal dorum x 2, right nasal dorsum x 1, right cheek x 2, right temple x 1, right upper forehead x 8, left forehead x 2  Destruction method: cryotherapy   Informed consent: discussed and consent obtained   Lesion destroyed using liquid nitrogen: Yes   Region frozen until ice ball extended beyond lesion: Yes   Outcome: patient tolerated procedure well with no complications   Post-procedure details: wound care instructions given   Additional details:  Prior to procedure, discussed risks of blister formation, small wound, skin dyspigmentation, or rare scar following cryotherapy. Recommend Vaseline ointment to treated areas while healing.   Dermatitis left plantar foot   Can use otc antibacterial ointment at any open areas and cover on left foot qd until healed.  Once healed can use Fluocinonide Cream at dry areas of foot qd to bid  Recommend starting moisturizer with exfoliant (Urea, Salicylic acid, or Lactic acid) one to two times daily to help smooth rough and bumpy skin.  OTC options include Cetaphil Rough  and Bumpy lotion (Urea), Eucerin Roughness Relief lotion or spot treatment cream (Urea), CeraVe SA lotion/cream for Rough and Bumpy skin (Sal Acid), Gold Bond Rough and Bumpy cream (Sal Acid), and AmLactin 12% lotion/cream (Lactic Acid).  If applying in morning, also apply sunscreen to sun-exposed areas, since these exfoliating moisturizers can increase sensitivity to sun.   Lentigines - Scattered tan macules - Due to sun exposure - Benign-appearing, observe - Recommend daily broad spectrum sunscreen SPF 30+ to sun-exposed areas, reapply every 2 hours as needed. - Call for any changes  Seborrheic Keratoses - Stuck-on, waxy, tan-brown papules and/or plaques  - Benign-appearing - Discussed benign etiology and prognosis. - Observe - Call for any changes  Melanocytic Nevi - Tan-brown and/or pink-flesh-colored symmetric macules and papules - Benign appearing on exam  today - Observation - Call clinic for new or changing moles - Recommend daily use of broad spectrum spf 30+ sunscreen to sun-exposed areas.   Hemangiomas - Red papules - Discussed benign nature - Observe - Call for any changes  Actinic Damage - Severe, confluent actinic changes with pre-cancerous actinic keratoses face - Severe, chronic, not at goal, secondary to cumulative UV radiation exposure over time - diffuse scaly erythematous macules and papules with underlying dyspigmentation - Discussed Prescription "Field Treatment" for Severe, Chronic Confluent Actinic Changes with Pre-Cancerous Actinic Keratoses Field treatment involves treatment of an entire area of skin that has confluent Actinic Changes (Sun/ Ultraviolet light damage) and PreCancerous Actinic Keratoses by method of PhotoDynamic Therapy (PDT) and/or prescription Topical Chemotherapy agents such as 5-fluorouracil, 5-fluorouracil/calcipotriene, and/or imiquimod.  The purpose is to decrease the number of clinically evident and subclinical PreCancerous lesions to prevent progression to development of skin cancer by chemically destroying early precancer changes that may or may not be visible.  It has been shown to reduce the risk of developing skin cancer in the treated area. As a result of treatment, redness, scaling, crusting, and open sores may occur during treatment course. One or more than one of these methods may be used and may have to be used several times to control, suppress and eliminate the PreCancerous changes. Discussed treatment course, expected reaction, and possible side effects. - Recommend daily broad spectrum sunscreen SPF 30+ to sun-exposed areas, reapply every 2 hours as needed.  - Staying in the shade or wearing long sleeves, sun glasses (UVA+UVB protection) and wide brim hats (4-inch brim around the entire circumference of the hat) are also recommended. - Call for new or changing lesions. Discussed PDT information  given in handout, pt may consider on f/up.  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  - Call if any new or changing lesions are noted between office visits  History of Squamous Cell Carcinoma of the Skin - No evidence of recurrence today - Recommend regular full body skin exams - Recommend daily broad spectrum sunscreen SPF 30+ to sun-exposed areas, reapply every 2 hours as needed.  - Call if any new or changing lesions are noted between office visits  Skin cancer screening performed today. Return for  6 month upper body exam recheck left antihelix ear . I, Ruthell Rummage, CMA, am acting as scribe for Brendolyn Patty, MD.  Documentation: I have reviewed the above documentation for  accuracy and completeness, and I agree with the above.  Brendolyn Patty MD

## 2022-05-29 ENCOUNTER — Ambulatory Visit
Admission: RE | Admit: 2022-05-29 | Discharge: 2022-05-29 | Disposition: A | Discharge status: Home / Self Care | Payer: Medicare Other | Source: Ambulatory Visit | Attending: Internal Medicine | Admitting: Internal Medicine

## 2022-05-29 DIAGNOSIS — Z87891 Personal history of nicotine dependence: Secondary | ICD-10-CM | POA: Insufficient documentation

## 2022-06-02 ENCOUNTER — Other Ambulatory Visit: Payer: Self-pay

## 2022-06-02 DIAGNOSIS — Z122 Encounter for screening for malignant neoplasm of respiratory organs: Secondary | ICD-10-CM

## 2022-06-02 DIAGNOSIS — Z87891 Personal history of nicotine dependence: Secondary | ICD-10-CM

## 2022-09-03 IMAGING — CT CT CHEST LUNG CANCER SCREENING LOW DOSE W/O CM
2 of 5 series · 15 of 40 positions shown, 18 images · non-contrast
Comparison: None.

CLINICAL DATA: Lung cancer screening. 46 pack-year history.
Asymptomatic former smoker.

EXAM:
CT CHEST WITHOUT CONTRAST LOW-DOSE FOR LUNG CANCER SCREENING
TECHNIQUE: Multidetector CT imaging of the chest was performed following the
standard protocol without IV contrast.

[Series 3: lung 1.00 · axial · 0.80mm/px · z∈[-1258,-911]mm · 12 of 385 slices shown, 15 images]
[im 19/385  mediastinal]
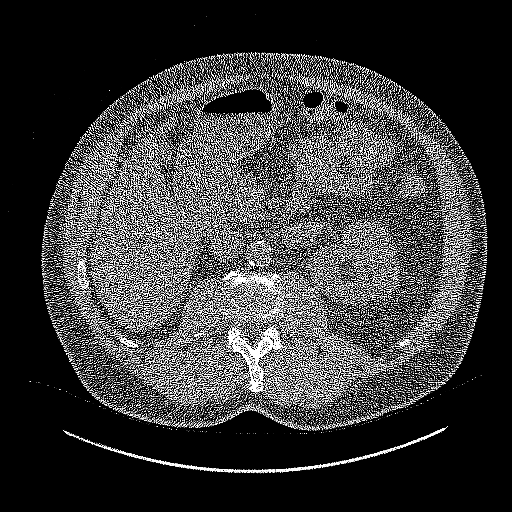
[im 19/385  lung]
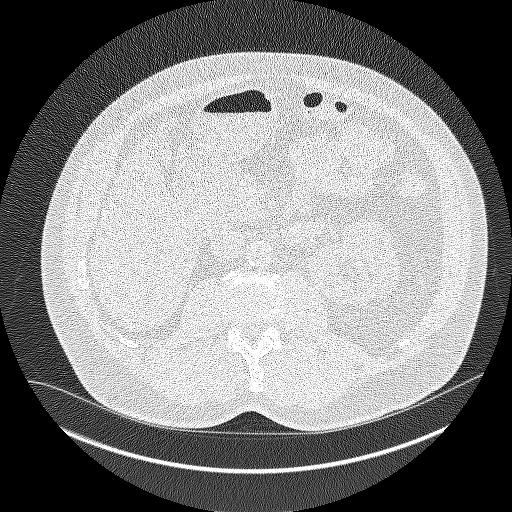
[im 55/385  lung]
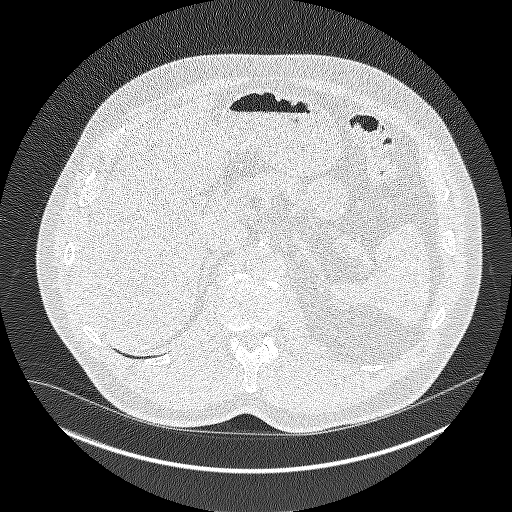
[im 92/385  lung]
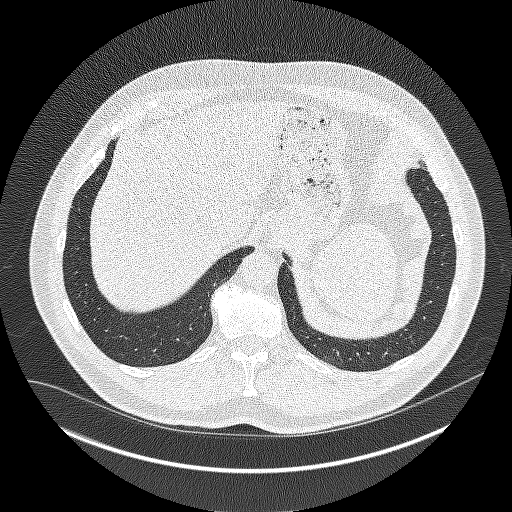
[im 110/385  lung]
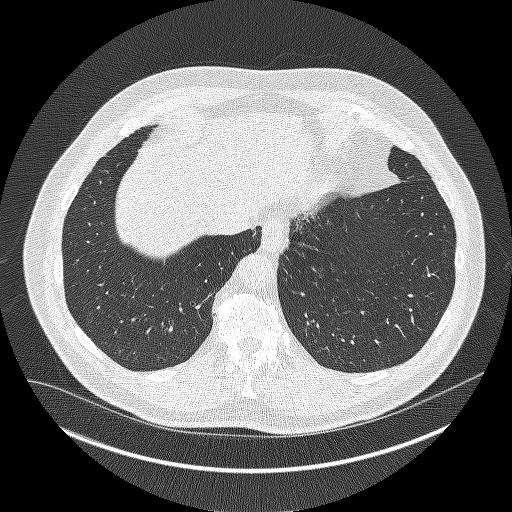
[im 147/385  mediastinal]
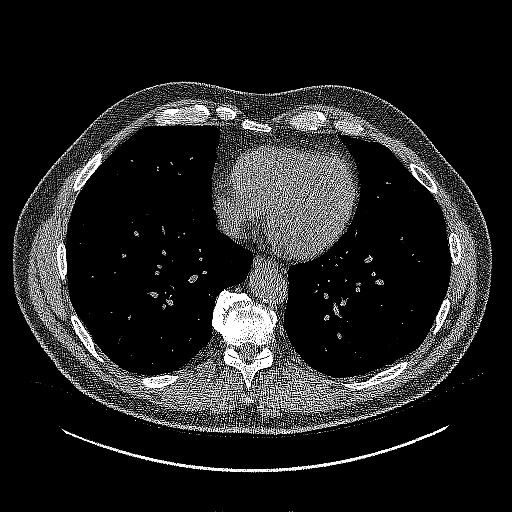
[im 147/385  lung]
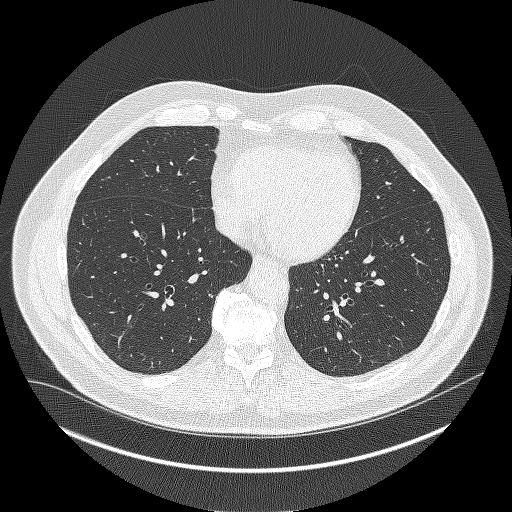
[im 183/385  lung]
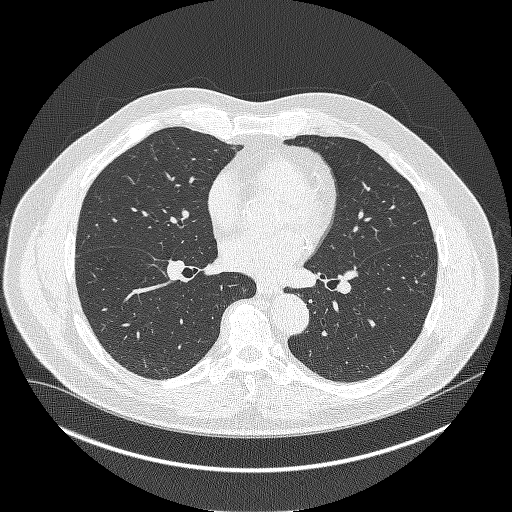
[im 202/385  lung]
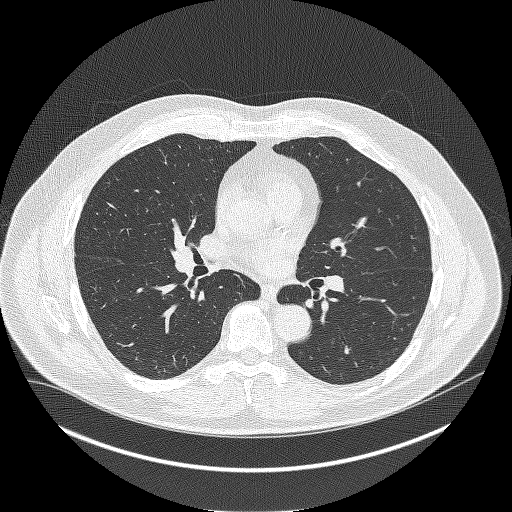
[im 238/385  lung]
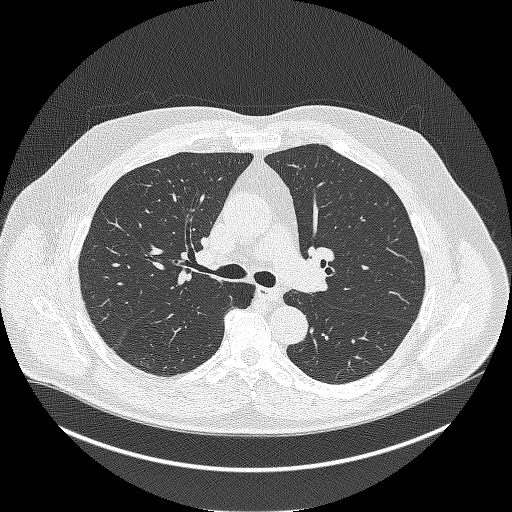
[im 275/385  mediastinal]
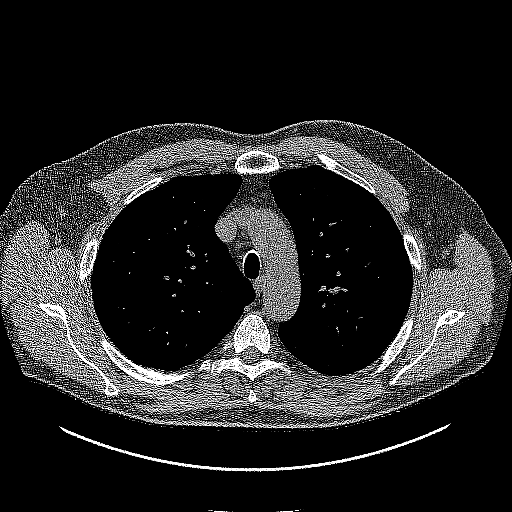
[im 275/385  lung]
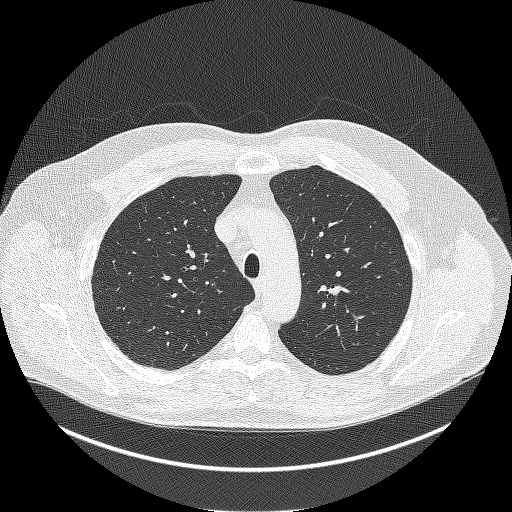
[im 293/385  lung]
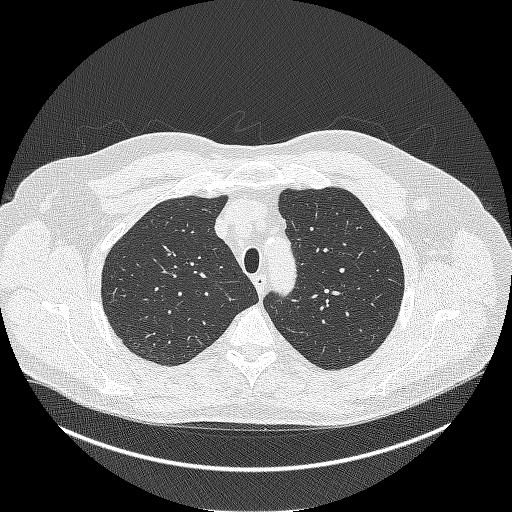
[im 330/385  lung]
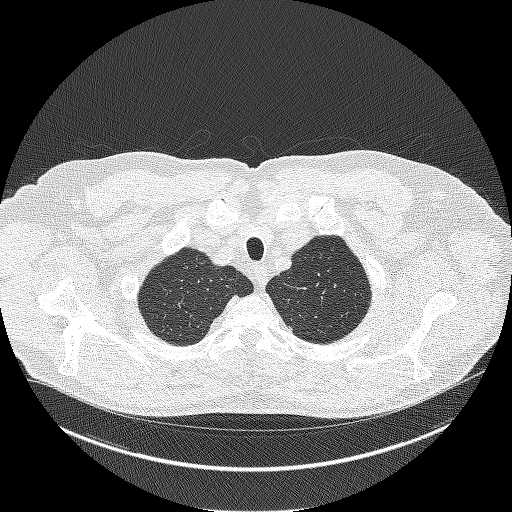
[im 366/385  lung]
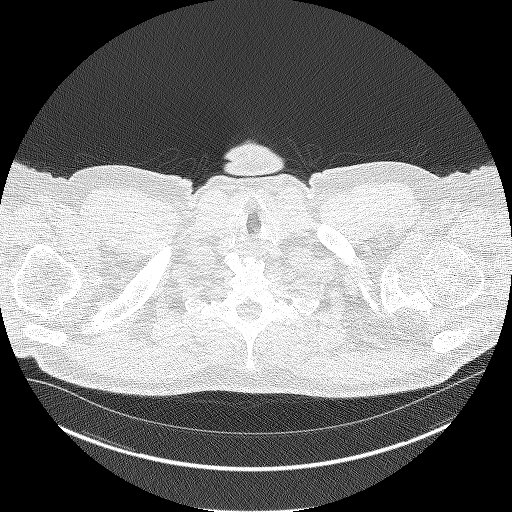

[Series 4: coronals lung 1.00 cor · coronal · 0.75mm/px · 3 of 371 slices shown]
[im 75/371  lung]
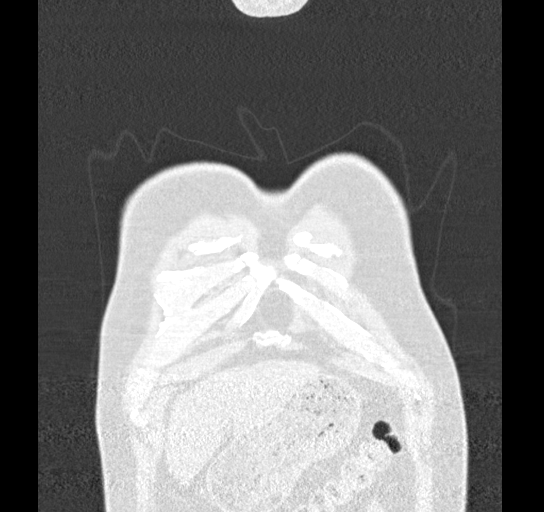
[im 149/371  lung]
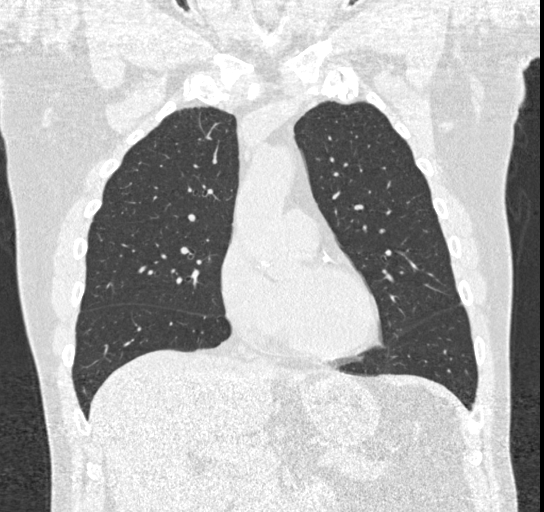
[im 223/371  lung]
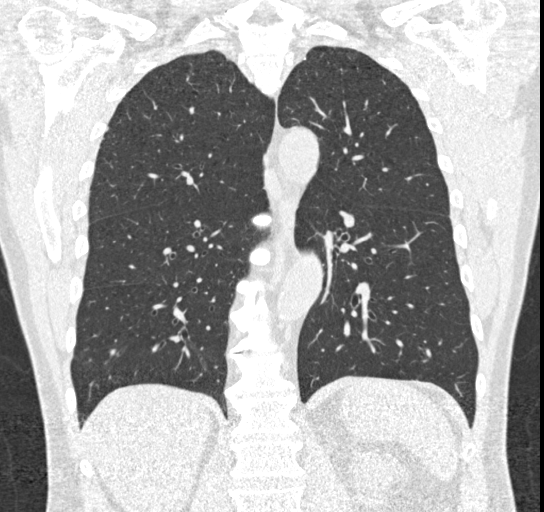

[15 of 40 positions shown; findings below may reference images not displayed]

FINDINGS: Cardiovascular: Heart size appears within normal limits. No
pericardial effusion. Aortic atherosclerosis. Coronary artery
calcifications identified.

Mediastinum/Nodes: No enlarged mediastinal, hilar, or axillary lymph
nodes. Thyroid gland, trachea, and esophagus demonstrate no
significant findings.

Lungs/Pleura: No pleural effusion. No airspace consolidation,
atelectasis or pneumothorax. Mild changes of emphysema. No
suspicious lung nodules identified.

Upper Abdomen: Mild hepatic steatosis. Aortic atherosclerosis
identified.

Musculoskeletal: Spondylosis identified within the thoracic spine.
No acute or suspicious osseous findings.
IMPRESSION: 1. Lung-RADS 1, negative. Continue annual screening with low-dose
chest CT without contrast in 12 months.
2. Coronary artery calcifications noted.
3. Hepatic steatosis.

Aortic Atherosclerosis (863AL-IUE.E) and Emphysema (863AL-MNT.4).

## 2022-11-11 ENCOUNTER — Ambulatory Visit: Payer: Medicare Other | Admitting: Dermatology

## 2022-12-23 ENCOUNTER — Ambulatory Visit: Payer: Medicare Other | Admitting: Dermatology

## 2023-01-14 ENCOUNTER — Encounter: Payer: Self-pay | Admitting: Dermatology

## 2023-01-14 ENCOUNTER — Ambulatory Visit (INDEPENDENT_AMBULATORY_CARE_PROVIDER_SITE_OTHER): Payer: Medicare Other | Admitting: Dermatology

## 2023-01-14 VITALS — BP 121/70 | HR 92

## 2023-01-14 DIAGNOSIS — X32XXXA Exposure to sunlight, initial encounter: Secondary | ICD-10-CM

## 2023-01-14 DIAGNOSIS — D492 Neoplasm of unspecified behavior of bone, soft tissue, and skin: Secondary | ICD-10-CM

## 2023-01-14 DIAGNOSIS — C44319 Basal cell carcinoma of skin of other parts of face: Secondary | ICD-10-CM | POA: Diagnosis not present

## 2023-01-14 DIAGNOSIS — L814 Other melanin hyperpigmentation: Secondary | ICD-10-CM

## 2023-01-14 DIAGNOSIS — L578 Other skin changes due to chronic exposure to nonionizing radiation: Secondary | ICD-10-CM

## 2023-01-14 DIAGNOSIS — Z1283 Encounter for screening for malignant neoplasm of skin: Secondary | ICD-10-CM

## 2023-01-14 DIAGNOSIS — L219 Seborrheic dermatitis, unspecified: Secondary | ICD-10-CM

## 2023-01-14 DIAGNOSIS — D1801 Hemangioma of skin and subcutaneous tissue: Secondary | ICD-10-CM

## 2023-01-14 DIAGNOSIS — L821 Other seborrheic keratosis: Secondary | ICD-10-CM

## 2023-01-14 DIAGNOSIS — Z85828 Personal history of other malignant neoplasm of skin: Secondary | ICD-10-CM

## 2023-01-14 DIAGNOSIS — W908XXA Exposure to other nonionizing radiation, initial encounter: Secondary | ICD-10-CM

## 2023-01-14 DIAGNOSIS — L570 Actinic keratosis: Secondary | ICD-10-CM | POA: Diagnosis not present

## 2023-01-14 DIAGNOSIS — C44310 Basal cell carcinoma of skin of unspecified parts of face: Secondary | ICD-10-CM

## 2023-01-14 NOTE — Progress Notes (Signed)
Follow-Up Visit   Subjective  Keith Barrera is a 72 y.o. male who presents for the following: Skin Cancer Screening and Upper Body Skin Exam. Hx of BCCs, HxSCCs. Recheck lesion at left upper forehead. Dur: 2 years. Bleeds at times.  New scaly spot on left cheek. Picks at and grows back.  The patient presents for Upper Body Skin Exam (UBSE) for skin cancer screening and mole check. The patient has spots, moles and lesions to be evaluated, some may be new or changing and the patient has concerns that these could be cancer.    The following portions of the chart were reviewed this encounter and updated as appropriate: medications, allergies, medical history  Review of Systems:  No other skin or systemic complaints except as noted in HPI or Assessment and Plan.  Objective  Well appearing patient in no apparent distress; mood and affect are within normal limits.  All skin waist up examined. Relevant physical exam findings are noted in the Assessment and Plan.  left upper forehead 5 mm indistinct pink macule with central crusting       left malar cheek x3, right medial cheek x1, upper forehead x6, nasal dorsum x1, nasal tip x1, left temple x1, left lateral cheek x1 (14) Erythematous thin papules/macules with gritty scale.     Assessment & Plan   HISTORY OF BASAL CELL CARCINOMA OF THE SKIN - No evidence of recurrence today - Recommend regular full body skin exams - Recommend daily broad spectrum sunscreen SPF 30+ to sun-exposed areas, reapply every 2 hours as needed.  - Call if any new or changing lesions are noted between office visits   HISTORY OF SQUAMOUS CELL CARCINOMA OF THE SKIN - No evidence of recurrence today - Recommend regular full body skin exams - Recommend daily broad spectrum sunscreen SPF 30+ to sun-exposed areas, reapply every 2 hours as needed.  - Call if any new or changing lesions are noted between office visits    Neoplasm of skin left upper  forehead  Epidermal / dermal shaving  Lesion diameter (cm):  0.5 Informed consent: discussed and consent obtained   Patient was prepped and draped in usual sterile fashion: Area prepped with alcohol. Anesthesia: the lesion was anesthetized in a standard fashion   Anesthetic:  1% lidocaine w/ epinephrine 1-100,000 buffered w/ 8.4% NaHCO3 Instrument used: flexible razor blade   Hemostasis achieved with: pressure, aluminum chloride and electrodesiccation   Outcome: patient tolerated procedure well   Post-procedure details: wound care instructions given   Post-procedure details comment:  Ointment and small bandage applied  Destruction of lesion  Destruction method: electrodesiccation and curettage   Timeout:  patient name, date of birth, surgical site, and procedure verified Anesthesia: the lesion was anesthetized in a standard fashion   Anesthetic:  1% lidocaine w/ epinephrine 1-100,000 buffered w/ 8.4% NaHCO3 Curettage performed in three different directions: Yes   Electrodesiccation performed over the curetted area: Yes   Curettage cycles:  3 Lesion length (cm):  0.5 Lesion width (cm):  0.5 Final wound size (cm):  0.8 Hemostasis achieved with:  pressure, aluminum chloride and electrodesiccation Outcome: patient tolerated procedure well with no complications   Post-procedure details: wound care instructions given    Specimen 1 - Surgical pathology Differential Diagnosis: R/O BCC  Check Margins: No  EDC today  AK (actinic keratosis) (14) left malar cheek x3, right medial cheek x1, upper forehead x6, nasal dorsum x1, nasal tip x1, left temple x1, left lateral cheek x1  Actinic keratoses are precancerous spots that appear secondary to cumulative UV radiation exposure/sun exposure over time. They are chronic with expected duration over 1 year. A portion of actinic keratoses will progress to squamous cell carcinoma of the skin. It is not possible to reliably predict which spots will  progress to skin cancer and so treatment is recommended to prevent development of skin cancer.  Recommend daily broad spectrum sunscreen SPF 30+ to sun-exposed areas, reapply every 2 hours as needed.  Recommend staying in the shade or wearing long sleeves, sun glasses (UVA+UVB protection) and wide brim hats (4-inch brim around the entire circumference of the hat). Call for new or changing lesions.  Destruction of lesion - left malar cheek x3, right medial cheek x1, upper forehead x6, nasal dorsum x1, nasal tip x1, left temple x1, left lateral cheek x1  Destruction method: cryotherapy   Informed consent: discussed and consent obtained   Lesion destroyed using liquid nitrogen: Yes   Region frozen until ice ball extended beyond lesion: Yes   Outcome: patient tolerated procedure well with no complications   Post-procedure details: wound care instructions given   Additional details:  Prior to procedure, discussed risks of blister formation, small wound, skin dyspigmentation, or rare scar following cryotherapy. Recommend Vaseline ointment to treated areas while healing.    Lentigines, Seborrheic Keratoses, Hemangiomas - Benign normal skin lesions - Benign-appearing - Call for any changes  Melanocytic Nevi - Tan-brown and/or pink-flesh-colored symmetric macules and papules - Benign appearing on exam today - Observation - Call clinic for new or changing moles - Recommend daily use of broad spectrum spf 30+ sunscreen to sun-exposed areas.   ACTINIC DAMAGE WITH PRECANCEROUS ACTINIC KERATOSES Counseling for Topical Chemotherapy Management: Patient exhibits: - Severe, confluent actinic changes with pre-cancerous actinic keratoses that is secondary to cumulative UV radiation exposure over time - Condition that is severe; chronic, not at goal. - diffuse scaly erythematous macules and papules with underlying dyspigmentation - Discussed Prescription "Field Treatment" topical Chemotherapy for  Severe, Chronic Confluent Actinic Changes with Pre-Cancerous Actinic Keratoses Field treatment involves treatment of an entire area of skin that has confluent Actinic Changes (Sun/ Ultraviolet light damage) and PreCancerous Actinic Keratoses by method of PhotoDynamic Therapy (PDT) and/or prescription Topical Chemotherapy agents such as 5-fluorouracil, 5-fluorouracil/calcipotriene, and/or imiquimod.  The purpose is to decrease the number of clinically evident and subclinical PreCancerous lesions to prevent progression to development of skin cancer by chemically destroying early precancer changes that may or may not be visible.  It has been shown to reduce the risk of developing skin cancer in the treated area. As a result of treatment, redness, scaling, crusting, and open sores may occur during treatment course. One or more than one of these methods may be used and may have to be used several times to control, suppress and eliminate the PreCancerous changes. Discussed treatment course, expected reaction, and possible side effects. - Recommend daily broad spectrum sunscreen SPF 30+ to sun-exposed areas, reapply every 2 hours as needed.  - Staying in the shade or wearing long sleeves, sun glasses (UVA+UVB protection) and wide brim hats (4-inch brim around the entire circumference of the hat) are also recommended. - Call for new or changing lesions.  Recommend PDT red light with debridement treatment this fall.   Skin cancer screening performed today.   Excoriation: Healing excoriation at left mid jaw.  RTC if not healed/resolved in 4-6 weeks   Seborrheic dermatitis scalp, face, chest, eyebrows, arms: erythema with scale  Chronic and persistent condition with duration or expected duration over one year. Condition is symptomatic/ bothersome to patient. Improving but not currently at goal.    Seborrheic Dermatitis  -  is a chronic persistent rash characterized by pinkness and scaling most commonly of  the mid face but also can occur on the scalp (dandruff), ears; mid chest, mid back and groin.  It tends to be exacerbated by stress and cooler weather.  People who have neurologic disease may experience new onset or exacerbation of existing seborrheic dermatitis.  The condition is not curable but treatable and can be controlled.   Continue Zoryve cream apply topically daily as needed at chest and other areas of body  Samples given     Return in about 6 months (around 07/17/2023) for AK Follow Up.  I, Lawson Radar, CMA, am acting as scribe for Willeen Niece, MD.   Documentation: I have reviewed the above documentation for accuracy and completeness, and I agree with the above.  Willeen Niece, MD

## 2023-01-14 NOTE — Patient Instructions (Addendum)
Wound Care Instructions  Cleanse wound gently with soap and water once a day then pat dry with clean gauze. Apply a thin coat of Petrolatum (petroleum jelly, "Vaseline") over the wound (unless you have an allergy to this). We recommend that you use a new, sterile tube of Vaseline. Do not pick or remove scabs. Do not remove the yellow or white "healing tissue" from the base of the wound.  Cover the wound with fresh, clean, nonstick gauze and secure with paper tape. You may use Band-Aids in place of gauze and tape if the wound is small enough, but would recommend trimming much of the tape off as there is often too much. Sometimes Band-Aids can irritate the skin.  You should call the office for your biopsy report after 1 week if you have not already been contacted.  If you experience any problems, such as abnormal amounts of bleeding, swelling, significant bruising, significant pain, or evidence of infection, please call the office immediately.  FOR ADULT SURGERY PATIENTS: If you need something for pain relief you may take 1 extra strength Tylenol (acetaminophen) AND 2 Ibuprofen (200mg  each) together every 4 hours as needed for pain. (do not take these if you are allergic to them or if you have a reason you should not take them.) Typically, you may only need pain medication for 1 to 3 days.    Cryotherapy Aftercare  Wash gently with soap and water everyday.   Apply Vaseline Jelly or Aquaphor daily until healed.      Recommend daily broad spectrum sunscreen SPF 30+ to sun-exposed areas, reapply every 2 hours as needed. Call for new or changing lesions.  Staying in the shade or wearing long sleeves, sun glasses (UVA+UVB protection) and wide brim hats (4-inch brim around the entire circumference of the hat) are also recommended for sun protection.    Melanoma ABCDEs  Melanoma is the most dangerous type of skin cancer, and is the leading cause of death from skin disease.  You are more likely to  develop melanoma if you: Have light-colored skin, light-colored eyes, or red or blond hair Spend a lot of time in the sun Tan regularly, either outdoors or in a tanning bed Have had blistering sunburns, especially during childhood Have a close family member who has had a melanoma Have atypical moles or large birthmarks  Early detection of melanoma is key since treatment is typically straightforward and cure rates are extremely high if we catch it early.   The first sign of melanoma is often a change in a mole or a new dark spot.  The ABCDE system is a way of remembering the signs of melanoma.  A for asymmetry:  The two halves do not match. B for border:  The edges of the growth are irregular. C for color:  A mixture of colors are present instead of an even brown color. D for diameter:  Melanomas are usually (but not always) greater than 6mm - the size of a pencil eraser. E for evolution:  The spot keeps changing in size, shape, and color.  Please check your skin once per month between visits. You can use a small mirror in front and a large mirror behind you to keep an eye on the back side or your body.   If you see any new or changing lesions before your next follow-up, please call to schedule a visit.  Please continue daily skin protection including broad spectrum sunscreen SPF 30+ to sun-exposed areas, reapplying every  2 hours as needed when you're outdoors.   Staying in the shade or wearing long sleeves, sun glasses (UVA+UVB protection) and wide brim hats (4-inch brim around the entire circumference of the hat) are also recommended for sun protection.    Due to recent changes in healthcare laws, you may see results of your pathology and/or laboratory studies on MyChart before the doctors have had a chance to review them. We understand that in some cases there may be results that are confusing or concerning to you. Please understand that not all results are received at the same time and  often the doctors may need to interpret multiple results in order to provide you with the best plan of care or course of treatment. Therefore, we ask that you please give [place] 2 business days to thoroughly review all your results before contacting the office for clarification. Should we see a critical lab result, you will be contacted sooner.   If You Need Anything After Your Visit  If you have any questions or concerns for your doctor, please call our main line at [phone] and press option 4 to reach your doctor's medical assistant. If no one answers, please leave a voicemail as directed and we will return your call as soon as possible. Messages left after 4 pm will be answered the following business day.   You may also send [place] a message via MyChart. We typically respond to MyChart messages within 1-2 business days.  For prescription refills, please ask your pharmacy to contact our office. Our fax number is [phone].  If you have an urgent issue when the clinic is closed that cannot wait until the next business day, you can page your doctor at the number below.    Please note that while we do our best to be available for urgent issues outside of office hours, we are not available 24/7.   If you have an urgent issue and are unable to reach [place], you may choose to seek medical care at your doctor's office, retail clinic, urgent care center, or emergency room.  If you have a medical emergency, please immediately call 911 or go to the emergency department.  Pager Numbers  - Dr. Gwen Pounds: [phone]  - Dr. Neale Burly: [phone]  - Dr. Roseanne Reno: [phone]  In the event of inclement weather, please call our main line at [phone] for an update on the status of any delays or closures.  Dermatology Medication Tips: Please keep the boxes that topical medications come in in order to help keep track of the instructions about where and how to use these. Pharmacies typically print the  medication instructions only on the boxes and not directly on the medication tubes.   If your medication is too expensive, please contact our office at [phone] option 4 or send [place] a message through MyChart.   We are unable to tell what your co-pay for medications will be in advance as this is different depending on your insurance coverage. However, we may be able to find a substitute medication at lower cost or fill out paperwork to get insurance to cover a needed medication.   If a prior authorization is required to get your medication covered by your insurance company, please allow [place] 1-2 business days to complete this process.  Drug prices often vary depending on where the prescription is filled and some pharmacies may offer cheaper prices.  The website www.goodrx.com contains coupons for medications through different pharmacies. The prices here do not account  for what the cost may be with help from insurance (it may be cheaper with your insurance), but the website can give you the price if you did not use any insurance.  - You can print the associated coupon and take it with your prescription to the pharmacy.  - You may also stop by our office during regular business hours and pick up a GoodRx coupon card.  - If you need your prescription sent electronically to a different pharmacy, notify our office through [facility] or by phone at [phone] option 4.     Si Usted Necesita Algo Despus de Su Visita  Tambin puede enviarnos un mensaje a travs de Clinical cytogeneticist. Por lo general respondemos a los mensajes de MyChart en el transcurso de 1 a 2 das hbiles.  Para renovar recetas, por favor pida a su farmacia que se ponga en contacto con nuestra oficina. Annie Sable de fax es [place] [phone].  Si tiene un asunto urgente cuando la clnica est cerrada y que no puede esperar hasta el siguiente da hbil, puede llamar/localizar a su doctor(a) al nmero que aparece a continuacin.    Por favor, tenga en cuenta que aunque hacemos todo lo posible para estar disponibles para asuntos urgentes fuera del horario de [place], no estamos disponibles las 24 horas del da, los 7 [address]  [address] de la [place].   Si tiene un problema urgente y no puede comunicarse con nosotros, puede optar por buscar atencin mdica  en el consultorio de su doctor(a), en una clnica privada, en un centro de atencin urgente o en una sala de emergencias.  Si tiene Engineer, drilling, por favor llame inmediatamente al 911 o vaya a la sala de emergencias.  Nmeros de bper  - Dr. Gwen Pounds: [phone]  - Dra. Moye: [phone]  - Dra. Roseanne Reno: [phone]  En caso de inclemencias del [place], por favor llame a Lacy Duverney principal al [phone] para una actualizacin sobre el [place] de cualquier retraso o cierre.  Consejos para la medicacin en dermatologa: Por favor, guarde las cajas en las que vienen los medicamentos de uso tpico para ayudarle a seguir las instrucciones sobre dnde y cmo usarlos. Las farmacias generalmente imprimen las instrucciones del medicamento slo en las cajas y no directamente en los tubos del [place].   Si su medicamento es muy caro, por favor, pngase en contacto con Rolm Gala llamando al [phone] y presione la opcin 4 o envenos un mensaje a travs de Clinical cytogeneticist.   No podemos decirle cul ser su copago por los medicamentos por adelantado ya que esto es diferente dependiendo de la cobertura de su seguro. Sin embargo, es posible que podamos encontrar un medicamento sustituto a Audiological scientist un formulario para que el seguro cubra el medicamento que se considera necesario.   Si se requiere una autorizacin previa para que su compaa de seguros [place] su medicamento, por favor permtanos de 1 a 2 das hbiles para completar [address]  Los precios de los medicamentos varan con frecuencia dependiendo del Environmental consultant de dnde se surte la receta y alguna  farmacias pueden ofrecer precios ms baratos.  El sitio web www.goodrx.com tiene cupones para medicamentos de Health and safety inspector. Los precios aqu no tienen en cuenta lo que podra costar con la ayuda del seguro (puede ser ms barato con su seguro), pero el sitio web puede darle el precio si no utiliz Tourist information centre manager.  - Puede imprimir el cupn correspondiente y llevarlo con su receta a la farmacia.  Laroy Apple  puede pasar por nuestra oficina durante el horario de atencin regular y Charity fundraiser una tarjeta de cupones de GoodRx.  - Si necesita que su receta se enve electrnicamente a una farmacia diferente, informe a nuestra oficina a travs de MyChart de [place] o por telfono llamando al [phone] y presione la opcin 4.

## 2023-01-19 ENCOUNTER — Telehealth: Payer: Self-pay

## 2023-01-19 NOTE — Telephone Encounter (Signed)
-----   Message from Willeen Niece, MD sent at 01/19/2023  1:00 PM EDT ----- Skin , left upper forehead BASAL CELL CARCINOMA, NODULAR PATTERN  BCC skin cancer- already treated with EDC at time of biopsy    - please call patient

## 2023-01-19 NOTE — Telephone Encounter (Signed)
Left pt msg to call for bx results/sh 

## 2023-01-19 NOTE — Telephone Encounter (Signed)
Discussed biopsy results with pt  °

## 2023-01-19 NOTE — Telephone Encounter (Signed)
-----   Message from Tara Stewart, MD sent at 01/19/2023  1:00 PM EDT ----- Skin , left upper forehead BASAL CELL CARCINOMA, NODULAR PATTERN  BCC skin cancer- already treated with EDC at time of biopsy    - please call patient 

## 2023-03-10 ENCOUNTER — Other Ambulatory Visit: Payer: Self-pay

## 2023-03-10 DIAGNOSIS — M7918 Myalgia, other site: Secondary | ICD-10-CM

## 2023-03-14 ENCOUNTER — Ambulatory Visit
Admission: RE | Admit: 2023-03-14 | Discharge: 2023-03-14 | Disposition: A | Discharge status: Home / Self Care | Payer: Medicare Other | Source: Ambulatory Visit | Attending: Surgery | Admitting: Surgery

## 2023-03-14 DIAGNOSIS — G8929 Other chronic pain: Secondary | ICD-10-CM | POA: Diagnosis present

## 2023-03-14 DIAGNOSIS — M7918 Myalgia, other site: Secondary | ICD-10-CM | POA: Insufficient documentation

## 2023-03-14 MED ORDER — GADOBUTROL 1 MMOL/ML IV SOLN
8.0000 mL | Freq: Once | INTRAVENOUS | Status: AC | PRN
  Administered 2023-03-14: 8 mL via INTRAVENOUS

## 2023-05-06 ENCOUNTER — Ambulatory Visit: Payer: Medicare Other

## 2023-05-06 DIAGNOSIS — Z1211 Encounter for screening for malignant neoplasm of colon: Secondary | ICD-10-CM | POA: Diagnosis present

## 2023-05-06 DIAGNOSIS — K2980 Duodenitis without bleeding: Secondary | ICD-10-CM | POA: Diagnosis not present

## 2023-05-06 DIAGNOSIS — D128 Benign neoplasm of rectum: Secondary | ICD-10-CM | POA: Diagnosis not present

## 2023-05-06 DIAGNOSIS — K209 Esophagitis, unspecified without bleeding: Secondary | ICD-10-CM | POA: Diagnosis not present

## 2023-05-06 DIAGNOSIS — K5730 Diverticulosis of large intestine without perforation or abscess without bleeding: Secondary | ICD-10-CM | POA: Diagnosis not present

## 2023-05-06 DIAGNOSIS — K2210 Ulcer of esophagus without bleeding: Secondary | ICD-10-CM | POA: Diagnosis not present

## 2023-05-06 DIAGNOSIS — Z86010 Personal history of colonic polyps: Secondary | ICD-10-CM | POA: Diagnosis not present

## 2023-05-08 ENCOUNTER — Other Ambulatory Visit: Payer: Self-pay | Admitting: Gastroenterology

## 2023-05-08 ENCOUNTER — Encounter: Payer: Self-pay | Admitting: Gastroenterology

## 2023-05-08 DIAGNOSIS — K604 Rectal fistula: Secondary | ICD-10-CM

## 2023-05-08 DIAGNOSIS — R933 Abnormal findings on diagnostic imaging of other parts of digestive tract: Secondary | ICD-10-CM

## 2023-05-15 ENCOUNTER — Ambulatory Visit
Admission: RE | Admit: 2023-05-15 | Discharge: 2023-05-15 | Disposition: A | Discharge status: Home / Self Care | Payer: Medicare Other | Source: Ambulatory Visit | Attending: Gastroenterology | Admitting: Gastroenterology

## 2023-05-15 ENCOUNTER — Other Ambulatory Visit: Payer: Self-pay | Admitting: Gastroenterology

## 2023-05-15 DIAGNOSIS — R933 Abnormal findings on diagnostic imaging of other parts of digestive tract: Secondary | ICD-10-CM | POA: Insufficient documentation

## 2023-05-15 DIAGNOSIS — K604 Rectal fistula: Secondary | ICD-10-CM | POA: Insufficient documentation

## 2023-05-15 MED ORDER — GADOBUTROL 1 MMOL/ML IV SOLN
8.0000 mL | Freq: Once | INTRAVENOUS | Status: AC | PRN
  Administered 2023-05-15: 8 mL via INTRAVENOUS

## 2023-06-01 ENCOUNTER — Ambulatory Visit
Admission: RE | Admit: 2023-06-01 | Discharge: 2023-06-01 | Disposition: A | Discharge status: Home / Self Care | Payer: Medicare Other | Source: Ambulatory Visit | Attending: Acute Care | Admitting: Acute Care

## 2023-06-01 DIAGNOSIS — Z122 Encounter for screening for malignant neoplasm of respiratory organs: Secondary | ICD-10-CM | POA: Insufficient documentation

## 2023-06-01 DIAGNOSIS — Z87891 Personal history of nicotine dependence: Secondary | ICD-10-CM | POA: Insufficient documentation

## 2023-06-17 ENCOUNTER — Other Ambulatory Visit: Payer: Self-pay | Admitting: Acute Care

## 2023-06-17 DIAGNOSIS — Z87891 Personal history of nicotine dependence: Secondary | ICD-10-CM

## 2023-06-17 DIAGNOSIS — Z122 Encounter for screening for malignant neoplasm of respiratory organs: Secondary | ICD-10-CM

## 2023-06-26 ENCOUNTER — Encounter: Payer: Self-pay | Admitting: Gastroenterology

## 2023-06-29 ENCOUNTER — Other Ambulatory Visit: Payer: Self-pay | Admitting: Gastroenterology

## 2023-06-29 DIAGNOSIS — R933 Abnormal findings on diagnostic imaging of other parts of digestive tract: Secondary | ICD-10-CM

## 2023-06-29 DIAGNOSIS — K56699 Other intestinal obstruction unspecified as to partial versus complete obstruction: Secondary | ICD-10-CM

## 2023-07-15 ENCOUNTER — Ambulatory Visit: Payer: Medicare Other | Admitting: Dermatology

## 2023-07-20 ENCOUNTER — Encounter: Payer: Self-pay | Admitting: Dermatology

## 2023-07-20 ENCOUNTER — Ambulatory Visit (INDEPENDENT_AMBULATORY_CARE_PROVIDER_SITE_OTHER): Payer: Medicare Other | Admitting: Dermatology

## 2023-07-20 DIAGNOSIS — L578 Other skin changes due to chronic exposure to nonionizing radiation: Secondary | ICD-10-CM

## 2023-07-20 DIAGNOSIS — Z85828 Personal history of other malignant neoplasm of skin: Secondary | ICD-10-CM | POA: Diagnosis not present

## 2023-07-20 DIAGNOSIS — L820 Inflamed seborrheic keratosis: Secondary | ICD-10-CM

## 2023-07-20 DIAGNOSIS — W908XXA Exposure to other nonionizing radiation, initial encounter: Secondary | ICD-10-CM | POA: Diagnosis not present

## 2023-07-20 DIAGNOSIS — L570 Actinic keratosis: Secondary | ICD-10-CM | POA: Diagnosis not present

## 2023-07-20 NOTE — Progress Notes (Signed)
Follow-Up Visit   Subjective  Keith Barrera is a 72 y.o. male who presents for the following: AK 22m f/u, face, scalp The patient has spots, moles and lesions to be evaluated, some may be new or changing and the patient may have concern these could be cancer.   The following portions of the chart were reviewed this encounter and updated as appropriate: medications, allergies, medical history  Review of Systems:  No other skin or systemic complaints except as noted in HPI or Assessment and Plan.  Objective  Well appearing patient in no apparent distress; mood and affect are within normal limits.   A focused examination was performed of the following areas: face  Relevant exam findings are noted in the Assessment and Plan.  L medial cheek x 2, L temple x 1, L frontal hairline x 2, R frontal hairline x 2, R malar cheek x 1, R temple x 1, R ear helix x 1, R nasal dorsum x 2, L nasal dorsum x 1 (13) Pink scaly macules  L upper forehead x 1 Stuck on waxy papule with erythema    Assessment & Plan   HISTORY OF BASAL CELL CARCINOMA OF THE SKIN - No evidence of recurrence today- L upper forehead clear - Recommend regular full body skin exams - Recommend daily broad spectrum sunscreen SPF 30+ to sun-exposed areas, reapply every 2 hours as needed.  - Call if any new or changing lesions are noted between office visits    AK (actinic keratosis) (13) L medial cheek x 2, L temple x 1, L frontal hairline x 2, R frontal hairline x 2, R malar cheek x 1, R temple x 1, R ear helix x 1, R nasal dorsum x 2, L nasal dorsum x 1  Actinic keratoses are precancerous spots that appear secondary to cumulative UV radiation exposure/sun exposure over time. They are chronic with expected duration over 1 year. A portion of actinic keratoses will progress to squamous cell carcinoma of the skin. It is not possible to reliably predict which spots will progress to skin cancer and so treatment is recommended to  prevent development of skin cancer.  Recommend daily broad spectrum sunscreen SPF 30+ to sun-exposed areas, reapply every 2 hours as needed.  Recommend staying in the shade or wearing long sleeves, sun glasses (UVA+UVB protection) and wide brim hats (4-inch brim around the entire circumference of the hat). Call for new or changing lesions.  Destruction of lesion - L medial cheek x 2, L temple x 1, L frontal hairline x 2, R frontal hairline x 2, R malar cheek x 1, R temple x 1, R ear helix x 1, R nasal dorsum x 2, L nasal dorsum x 1 (13)  Destruction method: cryotherapy   Informed consent: discussed and consent obtained   Lesion destroyed using liquid nitrogen: Yes   Region frozen until ice ball extended beyond lesion: Yes   Outcome: patient tolerated procedure well with no complications   Post-procedure details: wound care instructions given   Additional details:  Prior to procedure, discussed risks of blister formation, small wound, skin dyspigmentation, or rare scar following cryotherapy. Recommend Vaseline ointment to treated areas while healing.   Inflamed seborrheic keratosis L upper forehead x 1  Symptomatic, irritating, patient would like treated.  Recheck on f/u, consider biopsy if not improved  Destruction of lesion - L upper forehead x 1  Destruction method: cryotherapy   Informed consent: discussed and consent obtained   Lesion destroyed  using liquid nitrogen: Yes   Region frozen until ice ball extended beyond lesion: Yes   Outcome: patient tolerated procedure well with no complications   Post-procedure details: wound care instructions given   Additional details:  Prior to procedure, discussed risks of blister formation, small wound, skin dyspigmentation, or rare scar following cryotherapy. Recommend Vaseline ointment to treated areas while healing.    ACTINIC DAMAGE WITH PRECANCEROUS ACTINIC KERATOSES Counseling for Topical Chemotherapy Management: Patient exhibits: -  Severe, confluent actinic changes with pre-cancerous actinic keratoses that is secondary to cumulative UV radiation exposure over time - Condition that is severe; chronic, not at goal. - diffuse scaly erythematous macules and papules with underlying dyspigmentation - Discussed Prescription "Field Treatment" topical Chemotherapy for Severe, Chronic Confluent Actinic Changes with Pre-Cancerous Actinic Keratoses Field treatment involves treatment of an entire area of skin that has confluent Actinic Changes (Sun/ Ultraviolet light damage) and PreCancerous Actinic Keratoses by method of PhotoDynamic Therapy (PDT) and/or prescription Topical Chemotherapy agents such as 5-fluorouracil, 5-fluorouracil/calcipotriene, and/or imiquimod.  The purpose is to decrease the number of clinically evident and subclinical PreCancerous lesions to prevent progression to development of skin cancer by chemically destroying early precancer changes that may or may not be visible.  It has been shown to reduce the risk of developing skin cancer in the treated area. As a result of treatment, redness, scaling, crusting, and open sores may occur during treatment course. One or more than one of these methods may be used and may have to be used several times to control, suppress and eliminate the PreCancerous changes. Discussed treatment course, expected reaction, and possible side effects. - Recommend daily broad spectrum sunscreen SPF 30+ to sun-exposed areas, reapply every 2 hours as needed.  - Staying in the shade or wearing long sleeves, sun glasses (UVA+UVB protection) and wide brim hats (4-inch brim around the entire circumference of the hat) are also recommended. - Call for new or changing lesions.  - Pt defers PDT at this time and not interested in 5-FU cream due to severe reaction he had in past.  Prefers to continue treating with cryotherapy  Return in about 6 months (around 01/17/2024) for recheck ISK L upper forehead, UBSE, Hx  of AKs, Hx of BCC, Hx of SCC.  I, Ardis Rowan, RMA, am acting as scribe for Willeen Niece, MD .   Documentation: I have reviewed the above documentation for accuracy and completeness, and I agree with the above.  Willeen Niece, MD

## 2023-07-20 NOTE — Patient Instructions (Signed)

## 2023-07-22 ENCOUNTER — Ambulatory Visit: Payer: Medicare Other

## 2023-10-03 HISTORY — PX: OTHER SURGICAL HISTORY: SHX169

## 2024-03-28 ENCOUNTER — Encounter: Payer: Self-pay | Admitting: Dermatology

## 2024-03-28 ENCOUNTER — Ambulatory Visit: Payer: Medicare Other | Admitting: Dermatology

## 2024-03-28 DIAGNOSIS — L814 Other melanin hyperpigmentation: Secondary | ICD-10-CM | POA: Diagnosis not present

## 2024-03-28 DIAGNOSIS — L578 Other skin changes due to chronic exposure to nonionizing radiation: Secondary | ICD-10-CM | POA: Diagnosis not present

## 2024-03-28 DIAGNOSIS — L570 Actinic keratosis: Secondary | ICD-10-CM

## 2024-03-28 DIAGNOSIS — Z85828 Personal history of other malignant neoplasm of skin: Secondary | ICD-10-CM

## 2024-03-28 DIAGNOSIS — D485 Neoplasm of uncertain behavior of skin: Secondary | ICD-10-CM | POA: Diagnosis not present

## 2024-03-28 DIAGNOSIS — L821 Other seborrheic keratosis: Secondary | ICD-10-CM

## 2024-03-28 DIAGNOSIS — Z1283 Encounter for screening for malignant neoplasm of skin: Secondary | ICD-10-CM

## 2024-03-28 DIAGNOSIS — W908XXA Exposure to other nonionizing radiation, initial encounter: Secondary | ICD-10-CM | POA: Diagnosis not present

## 2024-03-28 DIAGNOSIS — D229 Melanocytic nevi, unspecified: Secondary | ICD-10-CM

## 2024-03-28 DIAGNOSIS — D1801 Hemangioma of skin and subcutaneous tissue: Secondary | ICD-10-CM

## 2024-03-28 DIAGNOSIS — Z7189 Other specified counseling: Secondary | ICD-10-CM

## 2024-03-28 DIAGNOSIS — B079 Viral wart, unspecified: Secondary | ICD-10-CM

## 2024-03-28 DIAGNOSIS — D492 Neoplasm of unspecified behavior of bone, soft tissue, and skin: Secondary | ICD-10-CM

## 2024-03-28 NOTE — Patient Instructions (Addendum)

## 2024-03-28 NOTE — Progress Notes (Signed)
 Follow-Up Visit   Subjective  Keith Barrera is a 73 y.o. male who presents for the following: Skin Cancer Screening and Upper Body Skin Exam hx of BCCs, SCCs, Aks, recheck ISK L upper forehead, wart R elbow ~64m, tender, has grown  The patient presents for Upper Body Skin Exam (UBSE) for skin cancer screening and mole check. The patient has spots, moles and lesions to be evaluated, some may be new or changing and the patient may have concern these could be cancer.    The following portions of the chart were reviewed this encounter and updated as appropriate: medications, allergies, medical history  Review of Systems:  No other skin or systemic complaints except as noted in HPI or Assessment and Plan.  Objective  Well appearing patient in no apparent distress; mood and affect are within normal limits.  All skin waist up examined. Relevant physical exam findings are noted in the Assessment and Plan.  Right Elbow 7.30mm pink keratotic pap  L malar cheek x 1, L temple x 1, L antehelix x 1, R preauricular x 1, R malar cheek x 1, Nasal dorsum x 2, nasal tip x 1 (8) Pink scaly macules  Assessment & Plan   NEOPLASM OF SKIN Right Elbow Epidermal / dermal shaving  Lesion diameter (cm):  0.7 Informed consent: discussed and consent obtained   Patient was prepped and draped in usual sterile fashion: area prepped with alcohol. Anesthesia: the lesion was anesthetized in a standard fashion   Anesthetic:  1% lidocaine w/ epinephrine 1-100,000 buffered w/ 8.4% NaHCO3 Instrument used: flexible razor blade   Hemostasis achieved with: pressure, aluminum chloride and electrodesiccation   Outcome: patient tolerated procedure well   Post-procedure details: wound care instructions given   Post-procedure details comment:  Ointment and small bandage applied  Destruction of lesion  Destruction method: electrodesiccation and curettage   Informed consent: discussed and consent obtained   Curettage  performed in three different directions: Yes   Electrodesiccation performed over the curetted area: Yes   Final wound size (cm):  0.9 Hemostasis achieved with:  pressure, aluminum chloride and electrodesiccation Outcome: patient tolerated procedure well with no complications   Post-procedure details: wound care instructions given   Additional details:  Mupirocin ointment and Bandaid applied   Specimen 1 - Surgical pathology Differential Diagnosis: Hypertrophic AK r/o SCC  Check Margins: yes 7.44mm pink keratotic pap EDC AK (ACTINIC KERATOSIS) (8) L malar cheek x 1, L temple x 1, L antehelix x 1, R preauricular x 1, R malar cheek x 1, Nasal dorsum x 2, nasal tip x 1 (8) Actinic keratoses are precancerous spots that appear secondary to cumulative UV radiation exposure/sun exposure over time. They are chronic with expected duration over 1 year. A portion of actinic keratoses will progress to squamous cell carcinoma of the skin. It is not possible to reliably predict which spots will progress to skin cancer and so treatment is recommended to prevent development of skin cancer.  Recommend daily broad spectrum sunscreen SPF 30+ to sun-exposed areas, reapply every 2 hours as needed.  Recommend staying in the shade or wearing long sleeves, sun glasses (UVA+UVB protection) and wide brim hats (4-inch brim around the entire circumference of the hat). Call for new or changing lesions. Destruction of lesion - L malar cheek x 1, L temple x 1, L antehelix x 1, R preauricular x 1, R malar cheek x 1, Nasal dorsum x 2, nasal tip x 1 (8)  Destruction method: cryotherapy  Informed consent: discussed and consent obtained   Lesion destroyed using liquid nitrogen: Yes   Region frozen until ice ball extended beyond lesion: Yes   Outcome: patient tolerated procedure well with no complications   Post-procedure details: wound care instructions given   Additional details:  Prior to procedure, discussed risks of  blister formation, small wound, skin dyspigmentation, or rare scar following cryotherapy. Recommend Vaseline ointment to treated areas while healing.   Skin cancer screening performed today.  ACTINIC DAMAGE WITH PRECANCEROUS ACTINIC KERATOSES Counseling for Topical Chemotherapy Management: Patient exhibits: - Severe, confluent actinic changes with pre-cancerous actinic keratoses that is secondary to cumulative UV radiation exposure over time - Condition that is severe; chronic, not at goal. - diffuse scaly erythematous macules and papules with underlying dyspigmentation - Discussed Prescription Field Treatment topical Chemotherapy for Severe, Chronic Confluent Actinic Changes with Pre-Cancerous Actinic Keratoses Field treatment involves treatment of an entire area of skin that has confluent Actinic Changes (Sun/ Ultraviolet light damage) and PreCancerous Actinic Keratoses by method of PhotoDynamic Therapy (PDT) and/or prescription Topical Chemotherapy agents such as 5-fluorouracil, 5-fluorouracil/calcipotriene, and/or imiquimod.  The purpose is to decrease the number of clinically evident and subclinical PreCancerous lesions to prevent progression to development of skin cancer by chemically destroying early precancer changes that may or may not be visible.  It has been shown to reduce the risk of developing skin cancer in the treated area. As a result of treatment, redness, scaling, crusting, and open sores may occur during treatment course. One or more than one of these methods may be used and may have to be used several times to control, suppress and eliminate the PreCancerous changes. Discussed treatment course, expected reaction, and possible side effects. - Recommend daily broad spectrum sunscreen SPF 30+ to sun-exposed areas, reapply every 2 hours as needed.  - Staying in the shade or wearing long sleeves, sun glasses (UVA+UVB protection) and wide brim hats (4-inch brim around the entire  circumference of the hat) are also recommended. - Call for new or changing lesions.  - Discussed Red light txt with debridement to face  Lentigines, Seborrheic Keratoses, Hemangiomas - Benign normal skin lesions - Benign-appearing - Call for any changes  Melanocytic Nevi - Tan-brown and/or pink-flesh-colored symmetric macules and papules - Benign appearing on exam today - Observation - Call clinic for new or changing moles - Recommend daily use of broad spectrum spf 30+ sunscreen to sun-exposed areas.   HISTORY OF BASAL CELL CARCINOMA OF THE SKIN - No evidence of recurrence today - Recommend regular full body skin exams - Recommend daily broad spectrum sunscreen SPF 30+ to sun-exposed areas, reapply every 2 hours as needed.  - Call if any new or changing lesions are noted between office visits  - R sup helix of the ear, R ant helix, L upper forehead  HISTORY OF SQUAMOUS CELL CARCINOMA OF THE SKIN - No evidence of recurrence today- L mid forearm, L mid hand, L submandibular neck, Base of R helix, L proximal extensor forearm,  L mid forearm - Recommend regular full body skin exams - Recommend daily broad spectrum sunscreen SPF 30+ to sun-exposed areas, reapply every 2 hours as needed.  - Call if any new or changing lesions are noted between office visits  SEBORRHEIC DERMATITIS Ears, mid chest Exam: Pink scaly patches ears, mid chest  Chronic and persistent condition with duration or expected duration over one year. Condition is symptomatic / bothersome to patient. Not to goal.  Seborrheic Dermatitis is a  chronic persistent rash characterized by pinkness and scaling most commonly of the mid face but also can occur on the scalp (dandruff), ears; mid chest, mid back and groin.  It tends to be exacerbated by stress and cooler weather.  People who have neurologic disease may experience new onset or exacerbation of existing seborrheic dermatitis.  The condition is not curable but treatable  and can be controlled.  Treatment Plan: Cont Zoryve cr qd prn flares samples x 3 given to pt, Lot XAAR 09/2025  Return for Redlight w/ Debridement face 10/2024, 35m AK f/u Dr. Jackquline.  I, Grayce Saunas, RMA, am acting as scribe for Rexene Jackquline, MD .   Documentation: I have reviewed the above documentation for accuracy and completeness, and I agree with the above.  Rexene Jackquline, MD

## 2024-03-31 LAB — SURGICAL PATHOLOGY

## 2024-04-06 ENCOUNTER — Ambulatory Visit: Payer: Self-pay | Admitting: Dermatology

## 2024-04-06 NOTE — Telephone Encounter (Signed)
-----   Message from Rexene Rattler sent at 04/06/2024 12:34 PM EDT ----- 1. Skin, right elbow :       VERRUCA VULGARIS, IRRITATED   Benign irritated wart, already treated with [facility] - please call patient ----- Message ----- From: Interface, Lab In [facility] Sent: 03/31/2024   5:20 PM EDT To: Rexene Rattler, MD

## 2024-04-06 NOTE — Telephone Encounter (Signed)
 Advised patient biopsy of the right elbow was benign wart. No further treatment needed.  Patient also questioned cost of upcoming Red light treatment to the face. Codes given to patient to find out from insurance. ICD 10 - L57.0; CPT Codes J7345, [id number].

## 2024-04-13 NOTE — Telephone Encounter (Signed)
 Spoke with patient this week. Patient and wife requested I called Medicare. I did try to call Medicare but was kept on hold with no one picking up. Advised patient clearly that I am not in insurance and billing. I can provide codes to the patient but I am unable to give any dollar amount or percentage covered from medicare. Patient was flustered and states he will cancel redlight appt if no one can give him an answer. Patient provided [facility] billing number to see if someone in that dept can help. Aw

## 2024-05-17 ENCOUNTER — Emergency Department

## 2024-05-17 ENCOUNTER — Emergency Department
Admission: EM | Admit: 2024-05-17 | Discharge: 2024-05-17 | Disposition: A | Discharge status: Home / Self Care | Attending: Emergency Medicine | Admitting: Emergency Medicine

## 2024-05-17 ENCOUNTER — Other Ambulatory Visit: Payer: Self-pay

## 2024-05-17 DIAGNOSIS — M545 Low back pain, unspecified: Secondary | ICD-10-CM | POA: Diagnosis present

## 2024-05-17 DIAGNOSIS — M533 Sacrococcygeal disorders, not elsewhere classified: Secondary | ICD-10-CM | POA: Diagnosis not present

## 2024-05-17 MED ORDER — LIDOCAINE 5 % EX PTCH
1.0000 | MEDICATED_PATCH | CUTANEOUS | Status: DC
  Administered 2024-05-17: 1 via TRANSDERMAL
  Filled 2024-05-17: qty 1

## 2024-05-17 MED ORDER — MUSCLE RUB 10-15 % EX CREA: 1.0000 | TOPICAL_CREAM | CUTANEOUS | 0 refills | Status: AC | PRN

## 2024-05-17 MED ORDER — ACETAMINOPHEN 500 MG PO TABS
1000.0000 mg | ORAL_TABLET | Freq: Once | ORAL | Status: AC
  Administered 2024-05-17: 1000 mg via ORAL
  Filled 2024-05-17: qty 2

## 2024-05-17 MED ORDER — DEXAMETHASONE 4 MG PO TABS
8.0000 mg | ORAL_TABLET | Freq: Once | ORAL | Status: AC
  Administered 2024-05-17: 8 mg via ORAL
  Filled 2024-05-17: qty 2

## 2024-05-17 MED ORDER — LIDOCAINE 5 % EX PTCH: 1.0000 | MEDICATED_PATCH | CUTANEOUS | 0 refills | Status: AC

## 2024-05-17 NOTE — ED Notes (Signed)
 Patient aware of need for urine sample ?

## 2024-05-17 NOTE — ED Provider Notes (Signed)
 [facility] Provider Note    Event Date/Time   First MD Initiated Contact with Patient 05/17/24 0411     (approximate)   History   Back Pain   HPI  Keith Barrera is a 73 y.o. male   Past medical history of gout, psoriasis, osteoarthritis, presents to the Emergency Department with right-sided lower back pain/upper buttock pain.  He was doing a lot of yard work a couple days ago.  He awoke with pain in that area.  No radiation of pain.  No direct trauma.  He takes prednisone 10 to 20 mg as needed for joint pains.  He took 1 pill yesterday with moderate relief, but was hesitant to take a full 20 mg dose because he had routine lab testing scheduled for today and did not want alternative his blood results.  Denies any urinary symptoms, incontinence, motor or sensory changes.  Independent Historian contributed to assessment above: Wife at bedside corroborates information given above  External Medical Documents Reviewed: Rheumatology visit from July 10      Physical Exam   Triage Vital Signs: ED Triage Vitals  Encounter Vitals Group     BP 05/17/24 0329 (!) 173/102     Girls Systolic BP Percentile --      Girls Diastolic BP Percentile --      Boys Systolic BP Percentile --      Boys Diastolic BP Percentile --      Pulse Rate 05/17/24 0329 90     Resp 05/17/24 0329 16     Temp 05/17/24 0329 98.2 F (36.8 C)     Temp Source 05/17/24 0329 Oral     SpO2 05/17/24 0329 96 %     Weight 05/17/24 0330 190 lb (86.2 kg)     Height 05/17/24 0330 5' 9 (1.753 m)     Head Circumference --      Peak Flow --      Pain Score 05/17/24 0329 5     Pain Loc --      Pain Education --      Exclude from Growth Chart --     Most recent vital signs: Vitals:   05/17/24 0329  BP: (!) 173/102  Pulse: 90  Resp: 16  Temp: 98.2 F (36.8 C)  SpO2: 96%    General: Awake, no distress.  CV:  Good peripheral perfusion.  Resp:  Normal effort. Abd:  No distention.   Other:  Point tenderness to the upper buttock right side, no skin changes.  No midline tenderness to the L-spine.  No step-off or deformity.  Ambulatory with steady gait.  No sensory changes.   ED Results / Procedures / Treatments   Labs (all labs ordered are listed, but only abnormal results are displayed) Labs Reviewed - No data to display     RADIOLOGY I independently reviewed and interpreted lumbar spine x-ray and see no obvious fracture I also reviewed radiologist's formal read.   PROCEDURES:  Critical Care performed: No  Procedures   MEDICATIONS ORDERED IN ED: Medications  dexamethasone  (DECADRON ) tablet 8 mg (has no administration in time range)  acetaminophen  (TYLENOL ) tablet 1,000 mg (has no administration in time range)  lidocaine  (LIDODERM ) 5 % 1 patch (has no administration in time range)     IMPRESSION / MDM / ASSESSMENT AND PLAN / ED COURSE  I reviewed the triage vital signs and the nursing notes.  Patient's presentation is most consistent with severe exacerbation of chronic illness.  Differential diagnosis includes, but is not limited to, arthritis, osteoarthritis/gout/psoriatic arthritis, muscle spasm or strain, sciatica, considered but less likely fractures, cord compression, kidney stone   The patient is on the cardiac monitor to evaluate for evidence of arrhythmia and/or significant heart rate changes.  MDM:    Lower back/sacroiliac area pain after doing extensive yard work the other day.  Since he has had great relief with steroids in the past will give one-time dose of dexamethasone , topical medications, Tylenol .  Given unremarkable exam as above point tenderness to the area of concern I doubt other life or limb threatening conditions like broken bones, cord compromise, and not consistent with vascular emergencies like aortic syndromes.  I considered hospitalization for admission or observation however given  adequate follow-up as an outpatient and treatment plan as above in this otherwise well-appearing patient I think outpatient follow-up is most appropriate        FINAL CLINICAL IMPRESSION(S) / ED DIAGNOSES   Final diagnoses:  Acute right-sided low back pain without sciatica  Sacroiliac joint pain     Rx / DC Orders   ED Discharge Orders          Ordered    lidocaine  (LIDODERM ) 5 %  Every 24 hours        05/17/24 0458    Menthol-Methyl Salicylate (MUSCLE RUB) 10-15 % CREA  As needed        05/17/24 0458             Note:  This document was prepared using Dragon voice recognition software and may include unintentional dictation errors.    Cyrena Mylar, MD 05/17/24 [phone]

## 2024-05-17 NOTE — Discharge Instructions (Signed)
 Use Tylenol  650 mg every 6 hours as needed for pain.  Use topical pain medications-try the Lidoderm  patch and the medicated gel I have prescribed for you.  Either can work well for pain.  Use one or the other.  You can apply heat to the area of pain  You can call orthopedist Dr. Aberman for a consultation for further assessment and treatment planning as needed.  Thank you for choosing us  for your health care today!  Please see your primary doctor this week for a follow up appointment.   If you have any new, worsening, or unexpected symptoms call your doctor right away or come back to the emergency department for reevaluation.  It was my pleasure to care for you today.   Ginnie EDISON Cyrena, MD

## 2024-05-17 NOTE — ED Notes (Signed)
Patient given discharge instructions including prescriptions x2 and importance of follow up appt as needed with stated understanding. Patient stable and ambulatory with steady even gait on dispo.

## 2024-05-17 NOTE — ED Triage Notes (Addendum)
 Pt reports lower back pain that began yesterday, pt reports arthritis in his hips. Pt denies relief with OTC meds at home. Pt denies recent falls or injury to his back

## 2024-05-20 ENCOUNTER — Other Ambulatory Visit: Payer: Self-pay | Admitting: Internal Medicine

## 2024-05-20 DIAGNOSIS — Z87891 Personal history of nicotine dependence: Secondary | ICD-10-CM

## 2024-06-09 ENCOUNTER — Other Ambulatory Visit: Payer: Self-pay | Admitting: Emergency Medicine

## 2024-06-09 DIAGNOSIS — Z122 Encounter for screening for malignant neoplasm of respiratory organs: Secondary | ICD-10-CM

## 2024-06-09 DIAGNOSIS — Z87891 Personal history of nicotine dependence: Secondary | ICD-10-CM

## 2024-06-14 ENCOUNTER — Ambulatory Visit
Admission: RE | Admit: 2024-06-14 | Discharge: 2024-06-14 | Disposition: A | Discharge status: Home / Self Care | Source: Ambulatory Visit | Attending: Emergency Medicine | Admitting: Emergency Medicine

## 2024-06-14 DIAGNOSIS — Z122 Encounter for screening for malignant neoplasm of respiratory organs: Secondary | ICD-10-CM | POA: Diagnosis present

## 2024-06-14 DIAGNOSIS — Z87891 Personal history of nicotine dependence: Secondary | ICD-10-CM | POA: Diagnosis present

## 2024-06-15 ENCOUNTER — Encounter (HOSPITAL_BASED_OUTPATIENT_CLINIC_OR_DEPARTMENT_OTHER): Payer: Self-pay | Admitting: Surgery

## 2024-06-15 ENCOUNTER — Other Ambulatory Visit: Payer: Self-pay

## 2024-06-21 ENCOUNTER — Ambulatory Visit: Payer: Self-pay | Admitting: Surgery

## 2024-06-21 DIAGNOSIS — Z01818 Encounter for other preprocedural examination: Secondary | ICD-10-CM

## 2024-06-22 ENCOUNTER — Other Ambulatory Visit: Payer: Self-pay

## 2024-06-22 ENCOUNTER — Ambulatory Visit (HOSPITAL_BASED_OUTPATIENT_CLINIC_OR_DEPARTMENT_OTHER): Payer: Self-pay | Admitting: Anesthesiology

## 2024-06-22 ENCOUNTER — Encounter (HOSPITAL_BASED_OUTPATIENT_CLINIC_OR_DEPARTMENT_OTHER)
Admission: RE | Disposition: A | Discharge status: Home / Self Care | Payer: Self-pay | Source: Home / Self Care | Attending: Surgery

## 2024-06-22 ENCOUNTER — Ambulatory Visit (HOSPITAL_BASED_OUTPATIENT_CLINIC_OR_DEPARTMENT_OTHER)
Admission: RE | Admit: 2024-06-22 | Discharge: 2024-06-22 | Disposition: A | Discharge status: Home / Self Care | Attending: Surgery | Admitting: Surgery

## 2024-06-22 ENCOUNTER — Encounter (HOSPITAL_BASED_OUTPATIENT_CLINIC_OR_DEPARTMENT_OTHER): Payer: Self-pay | Admitting: Surgery

## 2024-06-22 DIAGNOSIS — K4090 Unilateral inguinal hernia, without obstruction or gangrene, not specified as recurrent: Secondary | ICD-10-CM | POA: Diagnosis present

## 2024-06-22 DIAGNOSIS — E785 Hyperlipidemia, unspecified: Secondary | ICD-10-CM | POA: Insufficient documentation

## 2024-06-22 DIAGNOSIS — D176 Benign lipomatous neoplasm of spermatic cord: Secondary | ICD-10-CM | POA: Diagnosis not present

## 2024-06-22 DIAGNOSIS — Z87891 Personal history of nicotine dependence: Secondary | ICD-10-CM | POA: Diagnosis not present

## 2024-06-22 DIAGNOSIS — I10 Essential (primary) hypertension: Secondary | ICD-10-CM | POA: Insufficient documentation

## 2024-06-22 DIAGNOSIS — Z01818 Encounter for other preprocedural examination: Secondary | ICD-10-CM

## 2024-06-22 HISTORY — PX: INGUINAL HERNIA REPAIR: SHX194

## 2024-06-22 HISTORY — DX: Essential (primary) hypertension: I10

## 2024-06-22 HISTORY — DX: Gastro-esophageal reflux disease without esophagitis: K21.9

## 2024-06-22 SURGERY — REPAIR, HERNIA, INGUINAL, ADULT
Anesthesia: General | Laterality: Left

## 2024-06-22 MED ORDER — CHLORHEXIDINE GLUCONATE CLOTH 2 % EX PADS
6.0000 | MEDICATED_PAD | Freq: Once | CUTANEOUS | Status: DC
Start: 2024-06-22 — End: 2024-06-22

## 2024-06-22 MED ORDER — OXYCODONE HCL 5 MG PO TABS
5.0000 mg | ORAL_TABLET | Freq: Once | ORAL | Status: AC | PRN
  Administered 2024-06-22: 5 mg via ORAL

## 2024-06-22 MED ORDER — PHENYLEPHRINE HCL-NACL 20-0.9 MG/250ML-% IV SOLN
INTRAVENOUS | Status: AC
  Filled 2024-06-22: qty 250

## 2024-06-22 MED ORDER — PROPOFOL 10 MG/ML IV BOLUS
INTRAVENOUS | Status: DC | PRN
Start: 2024-06-22 — End: 2024-06-22
  Administered 2024-06-22: 200 mg via INTRAVENOUS

## 2024-06-22 MED ORDER — LACTATED RINGERS IV SOLN: INTRAVENOUS | Status: DC

## 2024-06-22 MED ORDER — ONDANSETRON HCL 4 MG/2ML IJ SOLN
INTRAMUSCULAR | Status: DC | PRN
  Administered 2024-06-22: 4 mg via INTRAVENOUS

## 2024-06-22 MED ORDER — CEFAZOLIN SODIUM-DEXTROSE 2-4 GM/100ML-% IV SOLN
2.0000 g | INTRAVENOUS | Status: AC
  Administered 2024-06-22: 2 g via INTRAVENOUS

## 2024-06-22 MED ORDER — OXYCODONE HCL 5 MG PO TABS
ORAL_TABLET | ORAL | Status: AC
  Filled 2024-06-22: qty 1

## 2024-06-22 MED ORDER — AMISULPRIDE (ANTIEMETIC) 5 MG/2ML IV SOLN: 10.0000 mg | Freq: Once | INTRAVENOUS | Status: DC | PRN

## 2024-06-22 MED ORDER — ONDANSETRON HCL 4 MG/2ML IJ SOLN
INTRAMUSCULAR | Status: AC
  Filled 2024-06-22: qty 2

## 2024-06-22 MED ORDER — FENTANYL CITRATE (PF) 100 MCG/2ML IJ SOLN
INTRAMUSCULAR | Status: AC
  Filled 2024-06-22: qty 2

## 2024-06-22 MED ORDER — 0.9 % SODIUM CHLORIDE (POUR BTL) OPTIME
TOPICAL | Status: DC | PRN
  Administered 2024-06-22: 1000 mL

## 2024-06-22 MED ORDER — CHLORHEXIDINE GLUCONATE CLOTH 2 % EX PADS: 6.0000 | MEDICATED_PAD | Freq: Once | CUTANEOUS | Status: DC

## 2024-06-22 MED ORDER — FENTANYL CITRATE (PF) 100 MCG/2ML IJ SOLN
INTRAMUSCULAR | Status: DC | PRN
  Administered 2024-06-22: 50 ug via INTRAVENOUS
  Administered 2024-06-22 (×2): 25 ug via INTRAVENOUS

## 2024-06-22 MED ORDER — LIDOCAINE HCL (PF) 1 % IJ SOLN
INTRAMUSCULAR | Status: DC | PRN
  Administered 2024-06-22: 15 mL

## 2024-06-22 MED ORDER — SUGAMMADEX SODIUM 200 MG/2ML IV SOLN
INTRAVENOUS | Status: DC | PRN
  Administered 2024-06-22: 200 mg via INTRAVENOUS

## 2024-06-22 MED ORDER — PHENYLEPHRINE 80 MCG/ML (10ML) SYRINGE FOR IV PUSH (FOR BLOOD PRESSURE SUPPORT)
PREFILLED_SYRINGE | INTRAVENOUS | Status: AC
  Filled 2024-06-22: qty 20

## 2024-06-22 MED ORDER — ROCURONIUM BROMIDE 100 MG/10ML IV SOLN
INTRAVENOUS | Status: DC | PRN
  Administered 2024-06-22: 5 mg via INTRAVENOUS
  Administered 2024-06-22: 50 mg via INTRAVENOUS

## 2024-06-22 MED ORDER — CEFAZOLIN SODIUM-DEXTROSE 2-4 GM/100ML-% IV SOLN
INTRAVENOUS | Status: AC
  Filled 2024-06-22: qty 100

## 2024-06-22 MED ORDER — ACETAMINOPHEN 500 MG PO TABS
ORAL_TABLET | ORAL | Status: AC
  Filled 2024-06-22: qty 2

## 2024-06-22 MED ORDER — ROCURONIUM BROMIDE 10 MG/ML (PF) SYRINGE
PREFILLED_SYRINGE | INTRAVENOUS | Status: AC
  Filled 2024-06-22: qty 20

## 2024-06-22 MED ORDER — PROPOFOL 500 MG/50ML IV EMUL
INTRAVENOUS | Status: AC
  Filled 2024-06-22: qty 50

## 2024-06-22 MED ORDER — FENTANYL CITRATE (PF) 100 MCG/2ML IJ SOLN
25.0000 ug | INTRAMUSCULAR | Status: DC | PRN
  Administered 2024-06-22: 50 ug via INTRAVENOUS

## 2024-06-22 MED ORDER — OXYCODONE HCL 5 MG PO TABS: 5.0000 mg | ORAL_TABLET | Freq: Four times a day (QID) | ORAL | 0 refills | Status: AC | PRN

## 2024-06-22 MED ORDER — PHENYLEPHRINE 80 MCG/ML (10ML) SYRINGE FOR IV PUSH (FOR BLOOD PRESSURE SUPPORT)
PREFILLED_SYRINGE | INTRAVENOUS | Status: AC
  Filled 2024-06-22: qty 10

## 2024-06-22 MED ORDER — EPHEDRINE SULFATE (PRESSORS) 25 MG/5ML IV SOSY
PREFILLED_SYRINGE | INTRAVENOUS | Status: DC | PRN
  Administered 2024-06-22 (×5): 5 mg via INTRAVENOUS

## 2024-06-22 MED ORDER — LIDOCAINE 2% (20 MG/ML) 5 ML SYRINGE
INTRAMUSCULAR | Status: AC
  Filled 2024-06-22: qty 10

## 2024-06-22 MED ORDER — PHENYLEPHRINE HCL (PRESSORS) 10 MG/ML IV SOLN
INTRAVENOUS | Status: DC | PRN
Start: 2024-06-22 — End: 2024-06-22
  Administered 2024-06-22 (×5): 80 ug via INTRAVENOUS

## 2024-06-22 MED ORDER — ACETAMINOPHEN 500 MG PO TABS
1000.0000 mg | ORAL_TABLET | ORAL | Status: AC
  Administered 2024-06-22: 1000 mg via ORAL

## 2024-06-22 MED ORDER — ARTIFICIAL TEARS OPHTHALMIC OINT
TOPICAL_OINTMENT | OPHTHALMIC | Status: AC
  Filled 2024-06-22: qty 14

## 2024-06-22 MED ORDER — LIDOCAINE HCL (CARDIAC) PF 100 MG/5ML IV SOSY
PREFILLED_SYRINGE | INTRAVENOUS | Status: DC | PRN
  Administered 2024-06-22: 50 mg via INTRAVENOUS

## 2024-06-22 MED ORDER — LIDOCAINE HCL (PF) 1 % IJ SOLN
INTRAMUSCULAR | Status: AC
  Filled 2024-06-22: qty 30

## 2024-06-22 MED ORDER — PROPOFOL 1000 MG/100ML IV EMUL
INTRAVENOUS | Status: AC
  Filled 2024-06-22: qty 200

## 2024-06-22 MED ORDER — DEXMEDETOMIDINE HCL IN NACL 80 MCG/20ML IV SOLN
INTRAVENOUS | Status: AC
  Filled 2024-06-22: qty 20

## 2024-06-22 MED ORDER — MIDAZOLAM HCL 2 MG/2ML IJ SOLN
INTRAMUSCULAR | Status: AC
  Filled 2024-06-22: qty 2

## 2024-06-22 MED ORDER — OXYCODONE HCL 5 MG/5ML PO SOLN: 5.0000 mg | Freq: Once | ORAL | Status: AC | PRN

## 2024-06-22 MED ORDER — DEXMEDETOMIDINE HCL IN NACL 80 MCG/20ML IV SOLN
INTRAVENOUS | Status: DC | PRN
  Administered 2024-06-22: 4 ug via INTRAVENOUS

## 2024-06-22 MED ORDER — EPHEDRINE 5 MG/ML INJ
INTRAVENOUS | Status: AC
  Filled 2024-06-22: qty 5

## 2024-06-22 MED ORDER — BUPIVACAINE-EPINEPHRINE (PF) 0.5% -1:200000 IJ SOLN
INTRAMUSCULAR | Status: AC
  Filled 2024-06-22: qty 60

## 2024-06-22 SURGICAL SUPPLY — 33 items
BLADE CLIPPER SURG (BLADE) IMPLANT
BLADE SURG 15 STRL LF DISP TIS (BLADE) ×1 IMPLANT
CHLORAPREP W/TINT 26 (MISCELLANEOUS) ×1 IMPLANT
COVER BACK TABLE 60X90IN (DRAPES) ×1 IMPLANT
COVER MAYO STAND STRL (DRAPES) ×1 IMPLANT
DERMABOND ADVANCED .7 DNX12 (GAUZE/BANDAGES/DRESSINGS) ×1 IMPLANT
DRAIN PENROSE .5X12 LATEX STL (DRAIN) ×1 IMPLANT
DRAPE LAPAROTOMY TRNSV 102X78 (DRAPES) ×1 IMPLANT
DRAPE UTILITY XL STRL (DRAPES) ×1 IMPLANT
ELECT COATED BLADE 2.86 ST (ELECTRODE) ×1 IMPLANT
ELECTRODE REM PT RTRN 9FT ADLT (ELECTROSURGICAL) ×1 IMPLANT
GLOVE BIO SURGEON STRL SZ7 (GLOVE) ×1 IMPLANT
GLOVE BIOGEL PI IND STRL 7.5 (GLOVE) ×1 IMPLANT
GOWN STRL REUS W/ TWL LRG LVL3 (GOWN DISPOSABLE) ×2 IMPLANT
MESH ULTRAPRO 3X6 7.6X15CM (Mesh General) IMPLANT
NDL HYPO 22X1.5 SAFETY MO (MISCELLANEOUS) ×1 IMPLANT
NEEDLE HYPO 22X1.5 SAFETY MO (MISCELLANEOUS) ×1 IMPLANT
NS IRRIG 1000ML POUR BTL (IV SOLUTION) IMPLANT
PACK BASIN DAY SURGERY FS (CUSTOM PROCEDURE TRAY) ×1 IMPLANT
PENCIL SMOKE EVACUATOR (MISCELLANEOUS) ×1 IMPLANT
SLEEVE SCD COMPRESS KNEE MED (STOCKING) IMPLANT
SPIKE FLUID TRANSFER (MISCELLANEOUS) IMPLANT
SPONGE T-LAP 4X18 ~~LOC~~+RFID (SPONGE) ×1 IMPLANT
SUT ETHIBOND 0 MO6 C/R (SUTURE) IMPLANT
SUT MNCRL AB 4-0 PS2 18 (SUTURE) ×1 IMPLANT
SUT SILK 2 0 SH (SUTURE) IMPLANT
SUT VIC AB 2-0 SH 18 (SUTURE) ×1 IMPLANT
SUT VIC AB 2-0 SH 27XBRD (SUTURE) IMPLANT
SUT VIC AB 3-0 SH 27X BRD (SUTURE) ×1 IMPLANT
SUT VICRYL 0 SH 27 (SUTURE) IMPLANT
SUT VICRYL AB 3 0 TIES (SUTURE) IMPLANT
SYR CONTROL 10ML LL (SYRINGE) ×1 IMPLANT
TOWEL GREEN STERILE FF (TOWEL DISPOSABLE) ×1 IMPLANT

## 2024-06-22 NOTE — Anesthesia Postprocedure Evaluation (Signed)
 Anesthesia Post Note  Patient: Keith Barrera  Procedure(s) Performed: REPAIR, HERNIA, INGUINAL, ADULT (Left)     Patient location during evaluation: PACU Anesthesia Type: General Level of consciousness: awake and alert Pain management: pain level controlled Vital Signs Assessment: post-procedure vital signs reviewed and stable Respiratory status: spontaneous breathing, nonlabored ventilation, respiratory function stable and patient connected to nasal cannula oxygen Cardiovascular status: blood pressure returned to baseline and stable Postop Assessment: no apparent nausea or vomiting Anesthetic complications: no   No notable events documented.  Last Vitals:  Vitals:   06/22/24 1030 06/22/24 1043  BP: 130/74 118/61  Pulse: 70 69  Resp: (!) 7 12  Temp:  (!) 36.2 C  SpO2: 93% 100%    Last Pain:  Vitals:   06/22/24 1053  TempSrc:   PainSc: 6                  Epifanio Lamar BRAVO

## 2024-06-22 NOTE — Transfer of Care (Signed)
 Immediate Anesthesia Transfer of Care Note  Patient: Keith Barrera  Procedure(s) Performed: REPAIR, HERNIA, INGUINAL, ADULT (Left)  Patient Location: PACU  Anesthesia Type:General  Level of Consciousness: awake and patient cooperative  Airway & Oxygen Therapy: Patient Spontanous Breathing and Patient connected to nasal cannula oxygen  Post-op Assessment: Report given to RN and Post -op Vital signs reviewed and stable  Post vital signs: Reviewed and stable  Last Vitals:  Vitals Value Taken Time  BP 131/71 06/22/24 10:05  Temp 36.2 C 06/22/24 10:05  Pulse 85 06/22/24 10:07  Resp 18 06/22/24 10:07  SpO2 96 % 06/22/24 10:07  Vitals shown include unfiled device data.  Last Pain:  Vitals:   06/22/24 1005  TempSrc:   PainSc: 0-No pain      Patients Stated Pain Goal: 6 (06/22/24 9356)  Complications: No notable events documented.

## 2024-06-22 NOTE — Anesthesia Preprocedure Evaluation (Addendum)
 Anesthesia Evaluation  Patient identified by MRN, date of birth, ID band Patient awake    Reviewed: Allergy & Precautions, NPO status , Patient's Chart, lab work & pertinent test results  Airway Mallampati: II  TM Distance: >3 FB Neck ROM: Full    Dental  (+) Dental Advisory Given   Pulmonary former smoker   breath sounds clear to auscultation       Cardiovascular hypertension, Pt. on medications  Rhythm:Regular Rate:Normal     Neuro/Psych negative neurological ROS     GI/Hepatic Neg liver ROS,GERD  ,,  Endo/Other  negative endocrine ROS    Renal/GU negative Renal ROS     Musculoskeletal   Abdominal   Peds  Hematology negative hematology ROS (+)   Anesthesia Other Findings   Reproductive/Obstetrics                              Anesthesia Physical Anesthesia Plan  ASA: 2  Anesthesia Plan: General   Post-op Pain Management: Tylenol  PO (pre-op)*   Induction: Intravenous  PONV Risk Score and Plan: 2 and Dexamethasone , Ondansetron and Treatment may vary due to age or medical condition  Airway Management Planned: Oral ETT and LMA  Additional Equipment: None  Intra-op Plan:   Post-operative Plan: Extubation in OR  Informed Consent: I have reviewed the patients History and Physical, chart, labs and discussed the procedure including the risks, benefits and alternatives for the proposed anesthesia with the patient or authorized representative who has indicated his/her understanding and acceptance.     Dental advisory given  Plan Discussed with: CRNA  Anesthesia Plan Comments:          Anesthesia Quick Evaluation

## 2024-06-22 NOTE — Op Note (Signed)
 06/22/2024  9:59 AM  PATIENT:  Keith Barrera  73 y.o. male  Patient Care Team: Lenon Layman ORN, MD as PCP - General (Internal Medicine)  PRE-OPERATIVE DIAGNOSIS:  Left inguinal hernia  POST-OPERATIVE DIAGNOSIS:  Left direct + indirect inguinal hernia  PROCEDURE:  Open left inguinal hernia repair with mesh  SURGEON:  Lonni HERO. Lashun Ramseyer, MD  ASSISTANT: OR Staff  ANESTHESIA:  General endotracheal  COUNTS:  Sponge, needle and instrument counts were reported correct x2 at the conclusion of the operation.  EBL: 10 mL  DRAINS: None  SPECIMEN: None  FINDINGS: Adipose containing direct inguinal hernia and additionally a small indirect inguinal hernia containing preperitoneal fat (cord lipoma).  Standard Lichtenstein repair was carried out uneventfully.  DESCRIPTION:  The patient was seen in the pre-op holding area. The risks, benefits, complications, treatment options, and expected outcomes were previously discussed with the patient. The patient agreed with the proposed plan and has signed the informed consent form. The patient was brought to the operating room by the surgical team, identified as [name], and the procedure verified. placed supine on the operating table and SCD's were applied. General anesthesia was induced without difficulty. He was positioned supine on the operating table.  Pressure points were evaluated and padded.  He had voided just prior to surgery so a foley catheter was not necessary. Hair on the abdomen was clipped.  He was secured to the operating table. The abdomen was then prepped and draped in the standard sterile fashion. A time out was completed and the above information confirmed and need for preoperative antibiotics.  0.25% Marcaine with epinephrine mixed with 1% lidocaine  was used to anesthetize the skin over the mid-portion of the inguinal canal. An oblique incision was then made. Dissection was carried down through the subcutaneous tissue  with cautery to the external oblique fascia.  The external oblique fascia was opened along the direction of its fibers to the external ring.  The spermatic cord and hernia sac was circumferentially dissected bluntly and retracted with a Penrose drain.  The ilioinguinal nerve was identified and found to be coursing through the planned area of repair where the mesh was laid and was therefore divided and removed from the inguinal floor.  The floor of the inguinal canal was inspected and a fat-containing direct inguinal hernias apparent.  The cord structures were all identified and then dissected.  There is a small associated cord lipoma/preperitoneal fat along the cord.  This was able to be carefully dissected free and removed.  The direct hernia was reduced and the floor partially imbricated using 0 Ethibond suture maintaining the hernia in the reduced position.  There was no significant or apparent hernia sac along the cord. A piece of light weight prolene mesh was cut to fit the inguinal floor and deployed. The mesh was secured to the pubic tubercle using two 0 Ethibond sutures - ensuring that there is at least 2 cm of overlap between the mesh and the pubic tubercle. The mesh was then secured using additional 0 Ethibond suture to the shelving edge of the inguinal ligament inferiorly and the conjoined tendon superiorly. The mesh was tucked underneath the external oblique fascia laterally.  The tails of the mesh were then closed around the spermatic cord to recreate the internal inguinal ring and this ring was secured using 0 Ethibond suture.  The external oblique fascia was reapproximated with 2-0 Vicryl.  3-0 Vicryl was then used to close Scarpa's fascia. A running 4-0 Monocryl subcuticular  suture was used to close the skin.  Dermabond was the applied. He was then awakened from general anesthesia, extubated and transferred to a stretcher for transport to PACU in satisfactory condition.  DISPOSITION: PACU in  satisfactory condition

## 2024-06-22 NOTE — H&P (Signed)
 CC: Here today for surgery  HPI: Manning Luna is an 73 y.o. male with history of HTN, HLD, arthritis, gout, psoriasis whom is seen in the office today as a referral by Dr. Arch for evaluation of possible anal fistula.   MRI pelvis 03/14/23 - 1. Arising from the posterior aspect of the low rectum, 7 o'clock face in lithotomy position, possibly above the level of the sphincters (i.e. possibly intersphincteric or suprasphincteric in origin), there is a posteriorly directed, blind ending fistula tract, which extends through the ischioanal fossa fat and into the right gluteus maximus muscle body with adjacent edema. There is no associated focal fluid collection. Tract is approximately 9 cm in length and does not appear to communicate to the skin. 2. Probable additional, previously treated and closed fistula tract extending inferiorly to the right aspect of the gluteal cleft. 3. Descending and sigmoid diverticulosis.  Colonoscopy completed by Dr. Lenon 04/2018 in [place]- - Postsurgical scarring on perianal exam - Very difficult exam due to fixed sigmoid - Diverticulosis in the sigmoid and descending. There is no evidence of diverticular bleeding. - 2 polyps removed 5 mm in size - Nonbleeding internal hemorrhoids - Repeat colonoscopy in 5 years  He reports that after his fistula surgery in the mid 90s he was told by a surgeon in [place]  that this could have been related to Crohn disease, unclear. He had done well in the interim until about 2-1/2 months ago when he had some vague right buttock discomfort and swelling. This never came to a head or drained. He underwent workup as noted above including a pelvic MRI that showed a blind-ending tract and a near supra sphincteric position terminating in the right gluteus maximus muscle.  Currently, he denies any gluteal pain. He does have some left hip pain and is following with orthopedics for possible injection for this. He does  have an underlying severe allergy to NSAIDs and therefore will take prednisone for any sort of inflammation he experiences in his body. He has been taking 10 to 20 mg/day of prednisone now for the last month or 2 and has noted improvement in all of his symptoms with regards to his right buttock pain.  INTERVAL HX MRE Abd/pelvis 05/15/23  IMPRESSION: 1. Descending and sigmoid diverticulosis, with long segment wall thickening of the sigmoid colon. This most likely reflects chronic sequelae of diverticulitis. No evidence of active inflammation or obstruction. 2. No inflammatory findings of the small bowel. 3. Interval surgical revision of a previously described fistula tract extending into the right gluteus musculature. No residual inflammatory findings. Evaluation of the lowest portion of the pelvis and buttocks is limited to select sequences on this non tailored examination.  He returns for follow-up. He has been doing well. He denies any abdominal pain. He denies any constipation or significant bloating. He does report that he has had a colonoscopy with Dr. Onita although we do not have a copy of that at this time. I am able to see Jonette Primmer PA note from gastro 05/18/23: He had some precancerous colon polyps removed. No evidence of Crohn's disease. Endoscopy commented on esophagitis with esophageal ulcer, duodenal erosions with biopsies showing active duodenitis.   The patient does note that he was unable to have a completed colonoscopy due to a fixed sigmoid colon. There are plans underway for a CT colonoscopy however.  INTERVAL HX OR with Dr. 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 He has done great since that procedure.  He returns to see me today for evaluation of a left inguinal hernia. He did have evidence of this on an MRI enterography completed 05/15/2023  which demonstrated fat-containing left inguinal hernia. This causes him discomfort particular with prolonged standing with a dull nagging sensation in the left groin. No history of any sort of incarceration of this hernia. Denies any prior surgical history with regards to inguinal/groin surgery. Denies any pain on the right side or right groin bulges.  He denies any changes in health or health history since we met in the office. No new medications/allergies. He states he is ready for surgery today.   PMH: HTN, HLD, arthritis, gout, psoriasis  PSH: As a pertains anorectal-I&D (08/1993) and what sounds like fistulectomy versus fistulotomy (10/1993) in [place].  FHx: Denies any known family history of colorectal, breast, endometrial or ovarian cancer  Social Hx: Denies use of tobacco/EtOH/illicit drug. He returns here today with his wife. He is been happily retired for 20 years, previously working as an Psychologist, educational for [employer]  Past Medical History:  Diagnosis Date   Basal cell carcinoma 08/29/2009   biopsy proven basal cell carcinoma of the right superior helix of the ear. area excised on 08/31/2009    Basal cell carcinoma 10/09/2020   R anterior helix   Basal cell carcinoma 01/14/2023   L upper forehead, [facility]   GERD (gastroesophageal reflux disease)    Hypertension    Squamous cell carcinoma of skin 06/14/2009   scc on left mid forearm removed by [facility]    Squamous cell carcinoma of skin 03/18/2010   left mid hand, left hand/wrist junction  scc/bowens disease    Squamous cell carcinoma of skin 08/12/2011   left submandibular neck    Squamous cell carcinoma of skin 05/04/2009   base of right helix, left proximal extensor forearm, left mid forearm     Past Surgical History:  Procedure Laterality Date   ANAL FISTULOTOMY     arm surgery Left    FEMUR SURGERY Left    lap sigmoidectomy  10/2023   at [facility]   PROCTOSIGMOIDOSCOPY      History reviewed. No pertinent family  history.  Social:  reports that he quit smoking about 11 years ago. His smoking use included cigarettes. He started smoking about 57 years ago. He has a 46 pack-year smoking history. He does not have any smokeless tobacco history on file. He reports current alcohol use. He reports that he does not use drugs.  Allergies:  Allergies  Allergen Reactions   Ibuprofen Anaphylaxis    Other reaction(s): Angioedema Tolerates meloxicam    Nsaids Other (See Comments)    Unable to take due to kidney disease   Sulfa Antibiotics Rash    Medications: I have reviewed the patient's current medications.  No results found for this or any previous visit (from the past 48 hours).  No results found.   PE Blood pressure (!) 172/92, pulse 88, temperature 97.8 F (36.6 C), temperature source Tympanic, resp. rate 19, height 5' 9 (1.753 m), weight 86.8 kg, SpO2 99%. Constitutional: NAD; conversant Eyes: Moist conjunctiva; no lid lag; anicteric Lungs: Normal respiratory effort CV: RRR GI: Abd soft, NT/ND; +Left inguinal hernia, marked Psychiatric: Appropriate affect  No results found for this or any previous visit (from the past 48 hours).  No results found.  A/P: Kaveon Blatz is an 73 y.o. 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 The pathophysiology of hernias was covered as well. -The options for treatment were reviewed including further observation (with risk of  worsening pain, incarceration of hernia, potential need for urgent/emergent procedures) vs surgery -open left inguinal hernia repair with mesh -The planned procedure, material risks (including, but not limited to, pain, bleeding, infection, scarring, need for blood transfusion, damage to surrounding structures-blood vessels/nerves/viscus/organs, need for additional procedures, recurrence, seroma, hematoma, chronic pain, mesh complications including erosion into other structures/vessels/organs/viscus, worsening of pre-existing medical conditions, blood clot, pulmonary embolus, pneumonia, heart attack, stroke, death) benefits and alternatives to surgery were discussed at length. I noted a good probability that the procedure help improve their symptoms. The patient's questions were answered to he and his wife's satisfaction, they voiced understanding and they elected to proceed with surgery. Additionally, we discussed typical postoperative expectations and the recovery process.   Lonni Pizza, MD [facility], [facility]

## 2024-06-22 NOTE — Anesthesia Procedure Notes (Signed)
 Procedure Name: Intubation Date/Time: 06/22/2024 8:44 AM  Performed by: Donnell Berwyn SQUIBB, CRNAPre-anesthesia Checklist: Patient identified, Emergency Drugs available, Suction available, Patient being monitored and Timeout performed Patient Re-evaluated:Patient Re-evaluated prior to induction Oxygen Delivery Method: Circle system utilized Preoxygenation: Pre-oxygenation with 100% oxygen Induction Type: IV induction Ventilation: Mask ventilation without difficulty and Oral airway inserted - appropriate to patient size Laryngoscope Size: Mac, 4 and Glidescope Tube size: 7.5 mm Number of attempts: 2 Placement Confirmation: ETT inserted through vocal cords under direct vision, positive ETCO2 and breath sounds checked- equal and bilateral Secured at: 23 cm Tube secured with: Tape Dental Injury: Teeth and Oropharynx as per pre-operative assessment  Difficulty Due To: Difficulty was anticipated and Difficult Airway- due to reduced neck mobility Comments: Unable to view cords with MAC 4 DL;Easy mask with airway and grade 1 view with glidescope

## 2024-06-22 NOTE — Discharge Instructions (Addendum)
 POST OP INSTRUCTIONS  DIET: As tolerated. Follow a light bland diet the first 24 hours after arrival home, such as soup, liquids, crackers, etc.  Be sure to include lots of fluids daily.  Avoid fast food or heavy meals as your are more likely to get nauseated.  Eat a low fat the next few days after surgery.  Take your usually prescribed home medications unless otherwise directed.  PAIN CONTROL: Pain is best controlled by a usual combination of three different methods TOGETHER: Ice/Heat Over the counter pain medication Prescription pain medication Most patients will experience some swelling and bruising around the surgical site.  Ice packs or heating pads (30-60 minutes up to 6 times a day) will help. Some people prefer to use ice alone, heat alone, alternating between ice & heat.  Experiment to what works for you.  Swelling and bruising can take several weeks to resolve.   It is helpful to take an over-the-counter pain medication regularly for the first few weeks: Ibuprofen (Motrin/Advil) - 200mg  tabs - take 3 tabs (600mg ) every 6 hours as needed for pain Acetaminophen  (Tylenol ) - you may take 650mg  every 6 hours as needed. You can take this with motrin as they act differently on the body. If you are taking a narcotic pain medication that has acetaminophen  in it, do not take over the counter tylenol  at the same time.  Iii. NOTE: You may take both of these medications together - most patients  find it most helpful when alternating between the two (i.e. Ibuprofen at 6am,  tylenol  at 9am, ibuprofen at 12pm ..SABRA) A  prescription for pain medication should be given to you upon discharge.  Take your pain medication as prescribed if your pain is not adequatly controlled with the over-the-counter pain reliefs mentioned above.  Avoid getting constipated.  Between the surgery and the pain medications, it is common to experience some constipation.  Increasing fluid intake and taking a fiber supplement (such as  Metamucil, Citrucel, FiberCon, MiraLax, etc) 1-2 times a day regularly will usually help prevent this problem from occurring.  A mild laxative (prune juice, Milk of Magnesia, MiraLax, etc) should be taken according to package directions if there are no bowel movements after 48 hours.    Dressing: Your incision is covered in Dermabond which is like sterile superglue for the skin. This will come off on it's own in a couple weeks. It is waterproof and you may bathe normally starting the day after your surgery in a shower. Avoid baths/pools/lakes/oceans until your wounds have fully healed.  ACTIVITIES as tolerated:   Avoid heavy lifting (>10lbs or 1 gallon of milk) for the next 6 weeks. You may resume regular (light) daily activities beginning the next day--such as daily self-care, walking, climbing stairs--gradually increasing activities as tolerated.  If you can walk 30 minutes without difficulty, it is safe to try more intense activity such as jogging, treadmill, bicycling, low-impact aerobics.  DO NOT PUSH THROUGH PAIN.  Let pain be your guide: If it hurts to do something, don't do it. You may drive when you are no longer taking prescription pain medication, you can comfortably wear a seatbelt, and you can safely maneuver your car and apply brakes.   FOLLOW UP in our office Please call [facility] at [phone] to set up an appointment to see your surgeon in the office for a follow-up appointment approximately 2 weeks after your surgery. Make sure that you call for this appointment the day you arrive home to  insure a convenient appointment time.  9. If you have disability or family leave forms that need to be completed, you may have them completed by your primary care physician's office; for return to work instructions, please ask our office staff and they will be happy to assist you in obtaining this documentation   When to call us  [phone]: Poor pain control Reactions / problems with new  medications (rash/itching, etc)  Fever over 101.5 F (38.5 C) Inability to urinate Nausea/vomiting Worsening swelling or bruising Continued bleeding from incision. Increased pain, redness, or drainage from the incision  The clinic staff is available to answer your questions during regular business hours (8:30am-5pm).  Please don't hesitate to call and ask to speak to one of our nurses for clinical concerns.   A surgeon from [facility] is always on call at the hospitals   If you have a medical emergency, go to the nearest emergency room or call 911.  [facility] [address], [place], [place]  [address] MAIN: [phone] FAX: [phone] [email or web address]   No tylenol  until 1pm   Post Anesthesia Home Care Instructions  Activity: Get plenty of rest for the remainder of the day. A responsible individual must stay with you for 24 hours following the procedure.  For the next 24 hours, DO NOT: -Drive a car -Advertising copywriter -Drink alcoholic beverages -Take any medication unless instructed by your physician -Make any legal decisions or sign important papers.  Meals: Start with liquid foods such as gelatin or soup. Progress to regular foods as tolerated. Avoid greasy, spicy, heavy foods. If nausea and/or vomiting occur, drink only clear liquids until the nausea and/or vomiting subsides. Call your physician if vomiting continues.  Special Instructions/Symptoms: Your throat may feel dry or sore from the anesthesia or the breathing tube placed in your throat during surgery. If this causes discomfort, gargle with warm salt water. The discomfort should disappear within 24 hours.  If you had a scopolamine patch placed behind your ear for the management of post- operative nausea and/or vomiting:  1. The medication in the patch is effective for 72 hours, after which it should be removed.  Wrap patch in a  tissue and discard in the trash. Wash hands thoroughly with soap and water. 2. You may remove the patch earlier than 72 hours if you experience unpleasant side effects which may include dry mouth, dizziness or visual disturbances. 3. Avoid touching the patch. Wash your hands with soap and water after contact with the patch.

## 2024-06-23 ENCOUNTER — Encounter (HOSPITAL_BASED_OUTPATIENT_CLINIC_OR_DEPARTMENT_OTHER): Payer: Self-pay | Admitting: Surgery

## 2024-08-18 ENCOUNTER — Other Ambulatory Visit: Payer: Self-pay

## 2024-08-18 ENCOUNTER — Encounter: Admission: RE | Disposition: A | Payer: Self-pay | Attending: Gastroenterology

## 2024-08-18 ENCOUNTER — Ambulatory Visit: Admitting: Registered Nurse

## 2024-08-18 ENCOUNTER — Encounter: Payer: Self-pay | Admitting: Gastroenterology

## 2024-08-18 ENCOUNTER — Ambulatory Visit
Admission: RE | Admit: 2024-08-18 | Discharge: 2024-08-18 | Disposition: A | Discharge status: Home / Self Care | Attending: Gastroenterology | Admitting: Gastroenterology

## 2024-08-18 DIAGNOSIS — D122 Benign neoplasm of ascending colon: Secondary | ICD-10-CM | POA: Diagnosis not present

## 2024-08-18 DIAGNOSIS — K6289 Other specified diseases of anus and rectum: Secondary | ICD-10-CM | POA: Insufficient documentation

## 2024-08-18 DIAGNOSIS — K219 Gastro-esophageal reflux disease without esophagitis: Secondary | ICD-10-CM | POA: Diagnosis not present

## 2024-08-18 DIAGNOSIS — K5730 Diverticulosis of large intestine without perforation or abscess without bleeding: Secondary | ICD-10-CM | POA: Insufficient documentation

## 2024-08-18 DIAGNOSIS — Z9049 Acquired absence of other specified parts of digestive tract: Secondary | ICD-10-CM | POA: Insufficient documentation

## 2024-08-18 DIAGNOSIS — D120 Benign neoplasm of cecum: Secondary | ICD-10-CM | POA: Diagnosis not present

## 2024-08-18 DIAGNOSIS — Z980 Intestinal bypass and anastomosis status: Secondary | ICD-10-CM | POA: Insufficient documentation

## 2024-08-18 DIAGNOSIS — Z87891 Personal history of nicotine dependence: Secondary | ICD-10-CM | POA: Diagnosis not present

## 2024-08-18 DIAGNOSIS — Z79899 Other long term (current) drug therapy: Secondary | ICD-10-CM | POA: Insufficient documentation

## 2024-08-18 DIAGNOSIS — Z1211 Encounter for screening for malignant neoplasm of colon: Secondary | ICD-10-CM | POA: Diagnosis not present

## 2024-08-18 DIAGNOSIS — D124 Benign neoplasm of descending colon: Secondary | ICD-10-CM | POA: Diagnosis not present

## 2024-08-18 DIAGNOSIS — I10 Essential (primary) hypertension: Secondary | ICD-10-CM | POA: Insufficient documentation

## 2024-08-18 DIAGNOSIS — K635 Polyp of colon: Secondary | ICD-10-CM | POA: Diagnosis not present

## 2024-08-18 DIAGNOSIS — Z860100 Personal history of colon polyps, unspecified: Secondary | ICD-10-CM | POA: Diagnosis present

## 2024-08-18 HISTORY — PX: COLONOSCOPY: SHX5424

## 2024-08-18 HISTORY — PX: RECTAL BIOPSY: SHX2303

## 2024-08-18 HISTORY — PX: POLYPECTOMY: SHX149

## 2024-08-18 SURGERY — COLONOSCOPY
Anesthesia: General

## 2024-08-18 MED ORDER — PROPOFOL 10 MG/ML IV BOLUS
INTRAVENOUS | Status: DC | PRN
  Administered 2024-08-18 (×3): 50 mg via INTRAVENOUS
  Administered 2024-08-18: 09:00:00 100 ug/kg/min via INTRAVENOUS
  Administered 2024-08-18: 09:00:00 50 mg via INTRAVENOUS

## 2024-08-18 MED ORDER — LIDOCAINE HCL (CARDIAC) PF 100 MG/5ML IV SOSY
PREFILLED_SYRINGE | INTRAVENOUS | Status: DC | PRN
  Administered 2024-08-18: 09:00:00 100 mg via INTRAVENOUS

## 2024-08-18 MED ORDER — LIDOCAINE HCL (PF) 2 % IJ SOLN
INTRAMUSCULAR | Status: AC
  Filled 2024-08-18: qty 5

## 2024-08-18 MED ORDER — SODIUM CHLORIDE 0.9 % IV SOLN: INTRAVENOUS | Status: DC

## 2024-08-18 NOTE — Anesthesia Postprocedure Evaluation (Signed)
 Anesthesia Post Note  Patient: Keith Barrera  Procedure(s) Performed: COLONOSCOPY POLYPECTOMY, INTESTINE  Patient location during evaluation: PACU Anesthesia Type: General Level of consciousness: awake and alert Pain management: pain level controlled Vital Signs Assessment: post-procedure vital signs reviewed and stable Respiratory status: spontaneous breathing, nonlabored ventilation, respiratory function stable and patient connected to nasal cannula oxygen Cardiovascular status: blood pressure returned to baseline and stable Postop Assessment: no apparent nausea or vomiting Anesthetic complications: no   No notable events documented.   Last Vitals:  Vitals:   08/18/24 0828  BP: (!) 147/93  Pulse: 94  Resp: 18  Temp: (!) 35.8 C  SpO2: 98%    Last Pain:  Vitals:   08/18/24 0828  TempSrc: Tympanic                 Lynwood KANDICE Clause

## 2024-08-18 NOTE — Interval H&P Note (Signed)
 History and Physical Interval Note: Preprocedure H&P from 08/18/2024  was reviewed and there was no interval change after seeing and examining the patient.  Written consent was obtained from the patient after discussion of risks, benefits, and alternatives. Patient has consented to proceed with Colonoscopy with possible intervention   08/18/2024 9:04 AM  Keith Barrera  has presented today for surgery, with the diagnosis of Hx of adenomatous colonic polyps (Z86.0101) History of partial colectomy (Z90.49).  The various methods of treatment have been discussed with the patient and family. After consideration of risks, benefits and other options for treatment, the patient has consented to  Procedures: COLONOSCOPY (N/A) as a surgical intervention.  The patient's history has been reviewed, patient examined, no change in status, stable for surgery.  I have reviewed the patient's chart and labs.  Questions were answered to the patient's satisfaction.     Elspeth Ozell Jungling

## 2024-08-18 NOTE — Op Note (Signed)
 [facility]enter[facility] Patient Name: Keith Barrera Procedure Date: 08/18/2024 8:53 AM MRN: [id number] Account #: [id number] Date of Birth: 02-Dec-1950 Admit Type: Outpatient Age: 73 Room: [facility] ENDO ROOM 1 Gender: Male Note Status: Finalized Instrument Name: Colon Scope [id number] Procedure:             Colonoscopy Indications:           High risk colon cancer surveillance: Personal history                         of colonic polyps, h/o partial colectomy Providers:             Elspeth Ozell Onita ROSALEA, DO Referring MD:          Layman ORN. Lenon MD, MD (Referring MD) Medicines:             Monitored Anesthesia Care Complications:         No immediate complications. Estimated blood loss:                         Minimal. Procedure:             Pre-Anesthesia Assessment:                        - Prior to the procedure, a History and Physical was                         performed, and patient medications and allergies were                         reviewed. The patient is competent. The risks and                         benefits of the procedure and the sedation options and                         risks were discussed with the patient. All questions                         were answered and informed consent was obtained.                         Patient identification and proposed procedure were                         verified by the physician, the nurse, the anesthetist                         and the technician in the endoscopy suite. Mental                         Status Examination: alert and oriented. Airway                         Examination: normal oropharyngeal airway and neck                         mobility. Respiratory Examination: clear to  auscultation. CV Examination: RRR, no murmurs, no S3                         or S4. Prophylactic Antibiotics: The patient does not                         require prophylactic antibiotics.  Prior                         Anticoagulants: The patient has taken no anticoagulant                         or antiplatelet agents. ASA Grade Assessment: III - A                         patient with severe systemic disease. After reviewing                         the risks and benefits, the patient was deemed in                         satisfactory condition to undergo the procedure. The                         anesthesia plan was to use monitored anesthesia care                         (MAC). Immediately prior to administration of                         medications, the patient was re-assessed for adequacy                         to receive sedatives. The heart rate, respiratory                         rate, oxygen saturations, blood pressure, adequacy of                         pulmonary ventilation, and response to care were                         monitored throughout the procedure. The physical                         status of the patient was re-assessed after the                         procedure.                        After obtaining informed consent, the colonoscope was                         passed under direct vision. Throughout the procedure,                         the patient's blood pressure, pulse, and oxygen  saturations were monitored continuously. The                         Colonoscope was introduced through the anus and                         advanced to the the terminal ileum, with                         identification of the appendiceal orifice and IC                         valve. The colonoscopy was performed without                         difficulty. The patient tolerated the procedure well.                         The quality of the bowel preparation was good. The                         terminal ileum, ileocecal valve, appendiceal orifice,                         and rectum were photographed. Findings:      The perianal exam  findings include previously repaired anorectal fistula.      The digital rectal exam was normal. Pertinent negatives include normal       sphincter tone.      The terminal ileum appeared normal. Estimated blood loss: none.      Multiple small-mouthed diverticula were found in the recto-sigmoid       colon. Estimated blood loss: none.      There was evidence of a prior functional end-to-end colo-colonic       anastomosis at 15 cm proximal to the anus. This was patent and was       characterized by healthy appearing mucosa and an intact staple line. The       anastomosis was traversed. Estimated blood loss: none.      Four sessile polyps were found in the descending colon, ascending colon       (2) and cecum. The polyps were 3 to 6 mm in size. These polyps were       removed with a cold snare. Resection and retrieval were complete.       Estimated blood loss was minimal.      A scar was found in the rectum. There was residual polypoid tissue.       Biopsies were taken with a cold forceps for histology. Estimated blood       loss was minimal.      The exam was otherwise without abnormality on direct and retroflexion       views. 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                        - The examined portion of the ileum was normal.                        -  Diverticulosis in the recto-sigmoid colon.                        - Patent functional end-to-end colo-colonic                         anastomosis, characterized by healthy appearing mucosa                         and an intact staple line.                        - Four 3 to 6 mm polyps in the descending colon, in                         the ascending colon and in the cecum, removed with a                         cold snare. Resected and retrieved.                        - Scar in the rectum. Biopsied.                        - The examination was otherwise normal  on direct and                         retroflexion views. Recommendation:        - Patient has a contact number available for                         emergencies. The signs and symptoms of potential                         delayed complications were discussed with the patient.                         Return to normal activities tomorrow. Written                         discharge instructions were provided to the patient.                        - Discharge patient to home.                        - Resume previous diet.                        - Continue present medications.                        - Await pathology results.                        - Repeat colonoscopy for surveillance based on                         pathology results.                        -  Return to referring physician as previously                         scheduled.                        - The findings and recommendations were discussed with                         the patient. Procedure Code(s):     --- Professional ---                        [id number], Colonoscopy, flexible; with removal of                         tumor(s), polyp(s), or other lesion(s) by snare                         technique                        45380, 59, Colonoscopy, flexible; with biopsy, single                         or multiple Diagnosis Code(s):     --- Professional ---                        Z86.010, Personal history of colonic polyps                        Z98.0, Intestinal bypass and anastomosis status                        D12.4, Benign neoplasm of descending colon                        D12.2, Benign neoplasm of ascending colon                        D12.0, Benign neoplasm of cecum                        K62.89, Other specified diseases of anus and rectum                        K57.30, Diverticulosis of large intestine without                         perforation or abscess without bleeding CPT copyright 2022 American Medical  Association. All rights reserved. The codes documented in this report are preliminary and upon coder review may  be revised to meet current compliance requirements. Attending Participation:      I personally performed the entire procedure. Elspeth Jungling, DO Elspeth Ozell Jungling DO, DO 08/18/2024 9:41:16 AM This report has been signed electronically. Number of Addenda: 0 Note Initiated On: 08/18/2024 8:53 AM Scope Withdrawal Time: 0 hours 13 minutes 38 seconds  Total Procedure Duration: 0 hours 17 minutes 8 seconds  Estimated Blood Loss:  Estimated blood loss was minimal.      [facility]

## 2024-08-18 NOTE — Anesthesia Procedure Notes (Signed)
 Procedure Name: MAC Date/Time: 08/18/2024 9:04 AM  Performed by: Lorrene Camelia LABOR, CRNAPre-anesthesia Checklist: Patient identified, Emergency Drugs available, Suction available and Patient being monitored Patient Re-evaluated:Patient Re-evaluated prior to induction Oxygen Delivery Method: Simple face mask Preoxygenation: Pre-oxygenation with 100% oxygen Induction Type: IV induction Comments: POM

## 2024-08-18 NOTE — H&P (Signed)
 Pre-Procedure H&P   Patient ID: Keith Barrera is a 73 y.o. male.  Gastroenterology Provider: Elspeth Ozell Jungling, DO  Referring Provider: Jonette Primmer, PA PCP: Lenon Layman ORN, MD  Date: 08/18/2024  HPI Keith Barrera is a 73 y.o. male who presents today for Colonoscopy for Personal history of colon polyp .  Patient underwent attempted colonoscopy in September 2024.  This was incomplete secondary to significantly narrowed lumen at 20 to 30 cm from the anus.  Could not advance through even with adult endoscope.  Several other adenomatous polyps were removed distal to this.  He underwent partial colectomy in February 2025 after MRI findings consistent with significant narrowing related to diverticular disease.  Now moving his bowels well 1-2 times a day without blood or melena.  History of anorectal fistula.   Past Medical History:  Diagnosis Date   Basal cell carcinoma 08/29/2009   biopsy proven basal cell carcinoma of the right superior helix of the ear. area excised on 08/31/2009    Basal cell carcinoma 10/09/2020   R anterior helix   Basal cell carcinoma 01/14/2023   L upper forehead, [facility]   GERD (gastroesophageal reflux disease)    Hypertension    Squamous cell carcinoma of skin 06/14/2009   scc on left mid forearm removed by [facility]    Squamous cell carcinoma of skin 03/18/2010   left mid hand, left hand/wrist junction  scc/bowens disease    Squamous cell carcinoma of skin 08/12/2011   left submandibular neck    Squamous cell carcinoma of skin 05/04/2009   base of right helix, left proximal extensor forearm, left mid forearm     Past Surgical History:  Procedure Laterality Date   ANAL FISTULOTOMY     arm surgery Left    FEMUR SURGERY Left    INGUINAL HERNIA REPAIR Left 06/22/2024   Procedure: REPAIR, HERNIA, INGUINAL, ADULT;  Surgeon: Teresa Lonni HERO, MD;  Location: [facility];  Service: General;  Laterality: Left;   lap sigmoidectomy   10/2023   at [facility]   PROCTOSIGMOIDOSCOPY      Family History No h/o GI disease or malignancy  Review of Systems  Constitutional:  Negative for activity change, appetite change, chills, diaphoresis, fatigue, fever and unexpected weight change.  HENT:  Negative for trouble swallowing and voice change.   Respiratory:  Negative for shortness of breath and wheezing.   Cardiovascular:  Negative for chest pain, palpitations and leg swelling.  Gastrointestinal:  Negative for abdominal distention, abdominal pain, anal bleeding, blood in stool, constipation, diarrhea, nausea and vomiting.  Musculoskeletal:  Negative for arthralgias and myalgias.  Skin:  Negative for color change and pallor.  Neurological:  Negative for dizziness, syncope and weakness.  Psychiatric/Behavioral:  Negative for confusion. The patient is not nervous/anxious.   All other systems reviewed and are negative.    Medications Medications Ordered Prior to Encounter[1]  Pertinent medications related to GI and procedure were reviewed by me with the patient prior to the procedure  Current Medications[2]  sodium chloride  20 mL/hr at 08/18/24 9161       Allergies[3] Allergies were reviewed by me prior to the procedure  Objective   Body mass index is 27.76 kg/m. Vitals:   08/18/24 0828  BP: (!) 147/93  Pulse: 94  Resp: 18  Temp: (!) 96.4 F (35.8 C)  TempSrc: Tympanic  SpO2: 98%  Weight: 85.3 kg  Height: 5' 9 (1.753 m)     Physical Exam Vitals and nursing  note reviewed.  Constitutional:      General: He is not in acute distress.    Appearance: Normal appearance. He is not ill-appearing, toxic-appearing or diaphoretic.  HENT:     Head: Normocephalic and atraumatic.     Nose: Nose normal.     Mouth/Throat:     Mouth: Mucous membranes are moist.     Pharynx: Oropharynx is clear.  Eyes:     General: No scleral icterus.    Extraocular Movements: Extraocular movements intact.  Cardiovascular:     Rate  and Rhythm: Normal rate and regular rhythm.     Heart sounds: Normal heart sounds. No murmur heard.    No friction rub. No gallop.  Pulmonary:     Effort: Pulmonary effort is normal. No respiratory distress.     Breath sounds: Normal breath sounds. No wheezing, rhonchi or rales.  Abdominal:     General: Bowel sounds are normal. There is no distension.     Palpations: Abdomen is soft.     Tenderness: There is no abdominal tenderness. There is no guarding or rebound.  Musculoskeletal:     Cervical back: Neck supple.     Right lower leg: No edema.     Left lower leg: No edema.  Skin:    General: Skin is warm and dry.     Coloration: Skin is not jaundiced or pale.  Neurological:     General: No focal deficit present.     Mental Status: He is alert and oriented to person, place, and time. Mental status is at baseline.  Psychiatric:        Mood and Affect: Mood normal.        Behavior: Behavior normal.        Thought Content: Thought content normal.        Judgment: Judgment normal.      Assessment:  Keith Barrera is a 73 y.o. male  who presents today for Colonoscopy for Personal history of colon polyp .  Plan:  Colonoscopy with possible intervention today  Colonoscopy with possible biopsy, control of bleeding, polypectomy, and interventions as necessary has been discussed with the patient/patient representative. Informed consent was obtained from the patient/patient representative after explaining the indication, nature, and risks of the procedure including but not limited to death, bleeding, perforation, missed neoplasm/lesions, cardiorespiratory compromise, and reaction to medications. Opportunity for questions was given and appropriate answers were provided. Patient/patient representative has verbalized understanding is amenable to undergoing the procedure.   Elspeth Ozell Jungling, DO  [facility]  Portions of the record may have been created with voice  recognition software. Occasional wrong-word or 'sound-a-like' substitutions may have occurred due to the inherent limitations of voice recognition software.  Read the chart carefully and recognize, using context, where substitutions may have occurred.    [1]  No current facility-administered medications on file prior to encounter.   Current Outpatient Medications on File Prior to Encounter  Medication Sig Dispense Refill   allopurinol (ZYLOPRIM) 300 MG tablet Take 300 mg by mouth daily.     colchicine 0.6 MG tablet Take by mouth.     lisinopril (ZESTRIL) 10 MG tablet Take 10 mg by mouth daily.     Multiple Vitamin (MULTI-VITAMIN) tablet Take 1 tablet by mouth daily.     Omega-3 Fatty Acids (RA FISH OIL) 1000 MG CAPS Take 1,200 mg by mouth in the morning and at bedtime.     clobetasol  (TEMOVATE ) 0.05 % external solution Apply 1  application topically 2 (two) times daily. Apply to back, legs, and arms. Avoid applying to face, groin, and axilla. Use as directed. Risk of skin atrophy with long-term use reviewed. 50 mL 1   fluocinonide  cream (LIDEX ) 0.05 % Spot treat affected areas on back once or twice daily as needed for itching. Avoid applying to face, groin, and axilla. Use as directed. Long-term use can cause thinning of the skin. 60 g 2   Menthol-Methyl Salicylate (MUSCLE RUB) 10-15 % CREA Apply 1 Application topically as needed. 85 g 0   predniSONE (DELTASONE) 10 MG tablet Take by mouth.    [2]  Current Facility-Administered Medications:    0.9 %  sodium chloride  infusion, , Intravenous, Continuous, Onita Elspeth Sharper, DO, Last Rate: 20 mL/hr at 08/18/24 0838, New Bag at 08/18/24 0838 [3]  Allergies Allergen Reactions   Ibuprofen Anaphylaxis    Other reaction(s): Angioedema Tolerates meloxicam    Nsaids Other (See Comments)    Unable to take due to kidney disease   Sulfa Antibiotics Rash

## 2024-08-18 NOTE — Anesthesia Preprocedure Evaluation (Signed)
 Anesthesia Evaluation  Patient identified by MRN, date of birth, ID band Patient awake    Reviewed: Allergy & Precautions, H&P , NPO status , Patient's Chart, lab work & pertinent test results, reviewed documented beta blocker date and time   Airway Mallampati: II   Neck ROM: full    Dental  (+) Poor Dentition   Pulmonary neg pulmonary ROS, former smoker   Pulmonary exam normal        Cardiovascular Exercise Tolerance: Good hypertension, On Medications negative cardio ROS Normal cardiovascular exam Rhythm:regular Rate:Normal     Neuro/Psych negative neurological ROS  negative psych ROS   GI/Hepatic Neg liver ROS,GERD  Medicated,,  Endo/Other  negative endocrine ROS    Renal/GU negative Renal ROS  negative genitourinary   Musculoskeletal   Abdominal   Peds  Hematology negative hematology ROS (+)   Anesthesia Other Findings Past Medical History: 08/29/2009: Basal cell carcinoma     Comment:  biopsy proven basal cell carcinoma of the right superior              helix of the ear. area excised on 08/31/2009  10/09/2020: Basal cell carcinoma     Comment:  R anterior helix 01/14/2023: Basal cell carcinoma     Comment:  L upper forehead, [facility] No date: GERD (gastroesophageal reflux disease) No date: Hypertension 06/14/2009: Squamous cell carcinoma of skin     Comment:  scc on left mid forearm removed by [facility]  03/18/2010: Squamous cell carcinoma of skin     Comment:  left mid hand, left hand/wrist junction  scc/bowens               disease  08/12/2011: Squamous cell carcinoma of skin     Comment:  left submandibular neck  05/04/2009: Squamous cell carcinoma of skin     Comment:  base of right helix, left proximal extensor forearm,               left mid forearm  Past Surgical History: No date: ANAL FISTULOTOMY No date: arm surgery; Left No date: FEMUR SURGERY; Left 06/22/2024: INGUINAL HERNIA REPAIR; Left      Comment:  Procedure: REPAIR, HERNIA, INGUINAL, ADULT;  Surgeon:               Teresa Lonni HERO, MD;  Location: [facility];  Service: General;  Laterality: Left; 10/2023: lap sigmoidectomy     Comment:  at [facility] No date: PROCTOSIGMOIDOSCOPY BMI    Body Mass Index: 27.76 kg/m     Reproductive/Obstetrics negative OB ROS                              Anesthesia Physical Anesthesia Plan  ASA: 2  Anesthesia Plan: General   Post-op Pain Management:    Induction:   PONV Risk Score and Plan:   Airway Management Planned:   Additional Equipment:   Intra-op Plan:   Post-operative Plan:   Informed Consent: I have reviewed the patients History and Physical, chart, labs and discussed the procedure including the risks, benefits and alternatives for the proposed anesthesia with the patient or authorized representative who has indicated his/her understanding and acceptance.     Dental Advisory Given  Plan Discussed with: CRNA  Anesthesia Plan Comments:         Anesthesia Quick Evaluation

## 2024-08-18 NOTE — Transfer of Care (Signed)
 Immediate Anesthesia Transfer of Care Note  Patient: Keith Barrera  Procedure(s) Performed: COLONOSCOPY POLYPECTOMY, INTESTINE  Patient Location: Endoscopy Unit  Anesthesia Type:General  Level of Consciousness: drowsy  Airway & Oxygen Therapy: Patient Spontanous Breathing  Post-op Assessment: Report given to RN, Post -op Vital signs reviewed and stable, and Patient moving all extremities  Post vital signs: Reviewed and stable  Last Vitals:  Vitals Value Taken Time  BP 116/62 08/18/24 09:34  Temp    Pulse 90 08/18/24 09:34  Resp 17 08/18/24 09:34  SpO2 97 % 08/18/24 09:34  Vitals shown include unfiled device data.  Last Pain:  Vitals:   08/18/24 0828  TempSrc: Tympanic         Complications: No notable events documented.

## 2024-08-20 LAB — SURGICAL PATHOLOGY

## 2024-08-30 ENCOUNTER — Ambulatory Visit: Admitting: Dermatology

## 2024-08-30 DIAGNOSIS — C44329 Squamous cell carcinoma of skin of other parts of face: Secondary | ICD-10-CM

## 2024-08-30 DIAGNOSIS — Z85828 Personal history of other malignant neoplasm of skin: Secondary | ICD-10-CM | POA: Diagnosis not present

## 2024-08-30 DIAGNOSIS — L578 Other skin changes due to chronic exposure to nonionizing radiation: Secondary | ICD-10-CM | POA: Diagnosis not present

## 2024-08-30 DIAGNOSIS — W908XXA Exposure to other nonionizing radiation, initial encounter: Secondary | ICD-10-CM

## 2024-08-30 DIAGNOSIS — L570 Actinic keratosis: Secondary | ICD-10-CM | POA: Diagnosis not present

## 2024-08-30 DIAGNOSIS — D485 Neoplasm of uncertain behavior of skin: Secondary | ICD-10-CM

## 2024-08-30 DIAGNOSIS — Z7189 Other specified counseling: Secondary | ICD-10-CM | POA: Diagnosis not present

## 2024-08-30 DIAGNOSIS — C44320 Squamous cell carcinoma of skin of unspecified parts of face: Secondary | ICD-10-CM

## 2024-08-30 MED ORDER — FLUOROURACIL 5 % EX CREA: TOPICAL_CREAM | CUTANEOUS | 1 refills | Status: AC

## 2024-08-30 NOTE — Patient Instructions (Addendum)
 - Start 5-fluorouracil cream once twice a day for 5-7 days to affected areas including face.  Reviewed course of treatment and expected reaction.  Patient advised to expect inflammation and crusting and advised that erosions are possible.  Patient advised to be diligent with sun protection during and after treatment. Handout with details of how to apply medication and what to expect provided. Counseled to keep medication out of reach of children and pets.   Wound Care Instructions  Cleanse wound gently with soap and water once a day then pat dry with clean gauze. Apply a thin coat of Petrolatum  (petroleum jelly, Vaseline) over the wound (unless you have an allergy to this). We recommend that you use a new, sterile tube of Vaseline. Do not pick or remove scabs. Do not remove the yellow or white healing tissue from the base of the wound.  Cover the wound with fresh, clean, nonstick gauze and secure with paper tape. You may use Band-Aids in place of gauze and tape if the wound is small enough, but would recommend trimming much of the tape off as there is often too much. Sometimes Band-Aids can irritate the skin.  You should call the office for your biopsy report after 1 week if you have not already been contacted.  If you experience any problems, such as abnormal amounts of bleeding, swelling, significant bruising, significant pain, or evidence of infection, please call the office immediately.  FOR ADULT SURGERY PATIENTS: If you need something for pain relief you may take 1 extra strength Tylenol  (acetaminophen ) AND 2 Ibuprofen (200mg  each) together every 4 hours as needed for pain. (do not take these if you are allergic to them or if you have a reason you should not take them.) Typically, you may only need pain medication for 1 to 3 days.      Due to recent changes in healthcare laws, you may see results of your pathology and/or laboratory studies on MyChart before the doctors have had a chance  to review them. We understand that in some cases there may be results that are confusing or concerning to you. Please understand that not all results are received at the same time and often the doctors may need to interpret multiple results in order to provide you with the best plan of care or course of treatment. Therefore, we ask that you please give us  2 business days to thoroughly review all your results before contacting the office for clarification. Should we see a critical lab result, you will be contacted sooner.   If You Need Anything After Your Visit  If you have any questions or concerns for your doctor, please call our main line at [phone] and press option 4 to reach your doctor's medical assistant. If no one answers, please leave a voicemail as directed and we will return your call as soon as possible. Messages left after 4 pm will be answered the following business day.   You may also send us  a message via MyChart. We typically respond to MyChart messages within 1-2 business days.  For prescription refills, please ask your pharmacy to contact our office. Our fax number is [phone].  If you have an urgent issue when the clinic is closed that cannot wait until the next business day, you can page your doctor at the number below.    Please note that while we do our best to be available for urgent issues outside of office hours, we are not available 24/7.   If  you have an urgent issue and are unable to reach us , you may choose to seek medical care at your doctor's office, retail clinic, urgent care center, or emergency room.  If you have a medical emergency, please immediately call 911 or go to the emergency department.  Pager Numbers  - Dr. Hester: [phone]  - Dr. Jackquline: [phone]  - Dr. Claudene: [phone]   - Dr. Raymund: [phone]  In the event of inclement weather, please call our main line at [phone] for an update on the status of any delays or  closures.  Dermatology Medication Tips: Please keep the boxes that topical medications come in in order to help keep track of the instructions about where and how to use these. Pharmacies typically print the medication instructions only on the boxes and not directly on the medication tubes.   If your medication is too expensive, please contact our office at [phone] option 4 or send us  a message through MyChart.   We are unable to tell what your co-pay for medications will be in advance as this is different depending on your insurance coverage. However, we may be able to find a substitute medication at lower cost or fill out paperwork to get insurance to cover a needed medication.   If a prior authorization is required to get your medication covered by your insurance company, please allow us  1-2 business days to complete this process.  Drug prices often vary depending on where the prescription is filled and some pharmacies may offer cheaper prices.  The website www.goodrx.com contains coupons for medications through different pharmacies. The prices here do not account for what the cost may be with help from insurance (it may be cheaper with your insurance), but the website can give you the price if you did not use any insurance.  - You can print the associated coupon and take it with your prescription to the pharmacy.  - You may also stop by our office during regular business hours and pick up a GoodRx coupon card.  - If you need your prescription sent electronically to a different pharmacy, notify our office through [facility] or by phone at [phone] option 4.     Si Usted Necesita Algo Despus de Su Visita  Tambin puede enviarnos un mensaje a travs de Clinical Cytogeneticist. Por lo general respondemos a los mensajes de MyChart en el transcurso de 1 a 2 das hbiles.  Para renovar recetas, por favor pida a su farmacia que se ponga en contacto con nuestra oficina. Randi lakes de fax  es [place] [phone].  Si tiene un asunto urgente cuando la clnica est cerrada y que no puede esperar hasta el siguiente da hbil, puede llamar/localizar a su doctor(a) al nmero que aparece a continuacin.   Por favor, tenga en cuenta que aunque hacemos todo lo posible para estar disponibles para asuntos urgentes fuera del horario de [place], no estamos disponibles las 24 horas del da, los 7 [address]  [address] de la [place].   Si tiene un problema urgente y no puede comunicarse con nosotros, puede optar por buscar atencin mdica  en el consultorio de su doctor(a), en una clnica privada, en un centro de atencin urgente o en una sala de emergencias.  Si tiene engineer, drilling, por favor llame inmediatamente al 911 o vaya a la sala de emergencias.  Nmeros de bper  - Dr. Hester: [phone]  - Dra. Jackquline: [phone]  - Dr. Claudene: [phone]  - Dra. Kitts: [phone]  En caso de  inclemencias del [place], por favor llame a nuestra lnea principal al [phone] para una actualizacin sobre el [place] de cualquier retraso o cierre.  Consejos para la medicacin en dermatologa: Por favor, guarde las cajas en las que vienen los medicamentos de uso tpico para ayudarle a seguir las instrucciones sobre dnde y cmo usarlos. Las farmacias generalmente imprimen las instrucciones del medicamento slo en las cajas y no directamente en los tubos del [place].   Si su medicamento es muy caro, por favor, pngase en contacto con landry rieger llamando al [phone] y presione la opcin 4 o envenos un mensaje a travs de Clinical Cytogeneticist.   No podemos decirle cul ser su copago por los medicamentos por adelantado ya que esto es diferente dependiendo de la cobertura de su seguro. Sin embargo, es posible que podamos encontrar un medicamento sustituto a audiological scientist un formulario para que el seguro cubra el medicamento que se considera necesario.   Si se requiere una autorizacin previa para  que su compaa de seguros cubra su medicamento, por favor permtanos de 1 a 2 das hbiles para completar este proceso.  Los precios de los medicamentos varan con frecuencia dependiendo del environmental consultant de dnde se surte la receta y alguna farmacias pueden ofrecer precios ms baratos.  El sitio web www.goodrx.com tiene cupones para medicamentos de health and safety inspector. Los precios aqu no tienen en cuenta lo que podra costar con la ayuda del seguro (puede ser ms barato con su seguro), pero el sitio web puede darle el precio si no utiliz tourist information centre manager.  - Puede imprimir el cupn correspondiente y llevarlo con su receta a la farmacia.  - Tambin puede pasar por nuestra oficina durante el horario de atencin regular y education officer, museum una tarjeta de cupones de GoodRx.  - Si necesita que su receta se enve electrnicamente a una farmacia diferente, informe a nuestra oficina a travs de MyChart de [place] o por telfono llamando al [phone] y presione la opcin 4.

## 2024-08-30 NOTE — Progress Notes (Signed)
 "  Follow-Up Visit   Subjective  Keith Barrera is a 72 y.o. male who presents for the following: spot on the right cheek, treated with cryotherapy 5 months ago, didn't go away, tender.  The patient has spots, moles and lesions to be evaluated, some may be new or changing and the patient may have concern these could be cancer.   The following portions of the chart were reviewed this encounter and updated as appropriate: medications, allergies, medical history  Review of Systems:  No other skin or systemic complaints except as noted in HPI or Assessment and Plan.  Objective  Well appearing patient in no apparent distress; mood and affect are within normal limits.  A focused examination was performed of the following areas: Face  Relevant physical exam findings are noted in the Assessment and Plan.  right medial cheek 6 mm pink firm pearly papule with central ulceration  R antihelix x 1, L antihelix x 1 (residual) (2) Pink scaly macules.   Assessment & Plan  ACTINIC DAMAGE WITH PRECANCEROUS ACTINIC KERATOSES Counseling for Topical Chemotherapy Management: Patient exhibits: - Severe, confluent actinic changes with pre-cancerous actinic keratoses that is secondary to cumulative UV radiation exposure over time - Condition that is severe; chronic, not at goal. - diffuse scaly erythematous macules and papules with underlying dyspigmentation - Discussed Prescription Field Treatment topical Chemotherapy for Severe, Chronic Confluent Actinic Changes with Pre-Cancerous Actinic Keratoses Field treatment involves treatment of an entire area of skin that has confluent Actinic Changes (Sun/ Ultraviolet light damage) and PreCancerous Actinic Keratoses by method of PhotoDynamic Therapy (PDT) and/or prescription Topical Chemotherapy agents such as 5-fluorouracil, 5-fluorouracil/calcipotriene, and/or imiquimod.  The purpose is to decrease the number of clinically evident and subclinical PreCancerous  lesions to prevent progression to development of skin cancer by chemically destroying early precancer changes that may or may not be visible.  It has been shown to reduce the risk of developing skin cancer in the treated area. As a result of treatment, redness, scaling, crusting, and open sores may occur during treatment course. One or more than one of these methods may be used and may have to be used several times to control, suppress and eliminate the PreCancerous changes. Discussed treatment course, expected reaction, and possible side effects. - Recommend daily broad spectrum sunscreen SPF 30+ to sun-exposed areas, reapply every 2 hours as needed.  - Staying in the shade or wearing long sleeves, sun glasses (UVA+UVB protection) and wide brim hats (4-inch brim around the entire circumference of the hat) are also recommended. - Call for new or changing lesions. Patient states chemical used for red light treatment not covered by his insurance.  Patient may treat different areas of the face with 5FU cream 1-2 times a day for 5-7 days.   HISTORY OF BASAL CELL CARCINOMA OF THE SKIN - No evidence of recurrence today - Recommend regular full body skin exams - Recommend daily broad spectrum sunscreen SPF 30+ to sun-exposed areas, reapply every 2 hours as needed.  - Call if any new or changing lesions are noted between office visits  HISTORY OF SQUAMOUS CELL CARCINOMA OF THE SKIN - No evidence of recurrence today - Recommend regular full body skin exams - Recommend daily broad spectrum sunscreen SPF 30+ to sun-exposed areas, reapply every 2 hours as needed.  - Call if any new or changing lesions are noted between office visits     NEOPLASM OF UNCERTAIN BEHAVIOR OF SKIN right medial cheek - Epidermal / dermal shaving  Lesion diameter (cm):  0.6 Informed consent: discussed and consent obtained   Patient was prepped and draped in usual sterile fashion: Area prepped with alcohol. Anesthesia: the  lesion was anesthetized in a standard fashion   Anesthetic:  1% lidocaine  w/ epinephrine  1-100,000 buffered w/ 8.4% NaHCO3 Instrument used: flexible razor blade   Hemostasis achieved with: pressure, aluminum chloride and electrodesiccation   Outcome: patient tolerated procedure well   Post-procedure details: wound care instructions given   Post-procedure details comment:  Ointment and small bandage applied  - Destruction of lesion  Destruction method: electrodesiccation and curettage   Informed consent: discussed and consent obtained   Curettage performed in three different directions: Yes   Electrodesiccation performed over the curetted area: Yes   Final wound size (cm):  0.7 Hemostasis achieved with:  pressure, aluminum chloride and electrodesiccation Outcome: patient tolerated procedure well with no complications   Post-procedure details: wound care instructions given   Post-procedure details comment:  Ointment and bandage applied.  Specimen 1 - Surgical pathology Differential Diagnosis: r/o BCC Check Margins: No EDC today Discussed treatment options if + for BCC. EDC vs EXC here vs Mohs surgery referral. EDC would leave a round depressed whitish scar about the same size as the original lesion.  It is treated here in office in a procedure we call scrape and burn.  No further pathology would be performed.  Mid to high 80% cure rate.  Mohs would leave a linear scar and pathology would be done at time of procedure to ensure complete removal.  It has a high 90s% cure rate. We would refer patient to a specialist for Mohs surgery. Patient mentioned [facility] (Image-Guided SRT), but deferred due to amount of treatments. Patient prefers to have EDC in office today, and is aware of resulting scar. If recurs, will need Mohs surgery.  AK (ACTINIC KERATOSIS) (2) R antihelix x 1, L antihelix x 1 (residual) (2) Actinic keratoses are precancerous spots that appear secondary to cumulative UV  radiation exposure/sun exposure over time. They are chronic with expected duration over 1 year. A portion of actinic keratoses will progress to squamous cell carcinoma of the skin. It is not possible to reliably predict which spots will progress to skin cancer and so treatment is recommended to prevent development of skin cancer.  Recommend daily broad spectrum sunscreen SPF 30+ to sun-exposed areas, reapply every 2 hours as needed.  Recommend staying in the shade or wearing long sleeves, sun glasses (UVA+UVB protection) and wide brim hats (4-inch brim around the entire circumference of the hat). Call for new or changing lesions. - Destruction of lesion - R antihelix x 1, L antihelix x 1 (residual) (2)  Destruction method: cryotherapy   Informed consent: discussed and consent obtained   Lesion destroyed using liquid nitrogen: Yes   Region frozen until ice ball extended beyond lesion: Yes   Outcome: patient tolerated procedure well with no complications   Post-procedure details: wound care instructions given   Additional details:  Prior to procedure, discussed risks of blister formation, small wound, skin dyspigmentation, or rare scar following cryotherapy. Recommend Vaseline ointment to treated areas while healing.   This Visit - fluorouracil (EFUDEX) 5 % cream - Apply once to twice a day for 5-7 days to affected areas including face.   Return as scheduled, for AK & bx f/u.  LILLETTE Andrea Kerns, CMA, am acting as scribe for Rexene Rattler, MD .   Documentation: I have reviewed the above documentation for accuracy and  completeness, and I agree with the above.  Rexene Rattler, MD    "

## 2024-09-02 LAB — SURGICAL PATHOLOGY

## 2024-09-05 ENCOUNTER — Ambulatory Visit: Payer: Self-pay | Admitting: Dermatology

## 2024-09-05 ENCOUNTER — Encounter: Payer: Self-pay | Admitting: Dermatology

## 2024-09-05 NOTE — Telephone Encounter (Signed)
-----   Message from Rexene Rattler, MD sent at 09/05/2024 11:52 AM EST ----- 1. Skin, right medial cheek :       WELL DIFFERENTIATED SQUAMOUS CELL CARCINOMA   SCC skin cancer- already treated with EDC at time of biopsy, if any recurrence, would need Mohs surgery  - please call patient

## 2024-09-05 NOTE — Telephone Encounter (Signed)
 Advised patient biopsy of the right medial cheek was SCC and has already been treated with EDC. If recurs, will need Mohs surgery.

## 2024-10-04 ENCOUNTER — Ambulatory Visit

## 2024-10-04 ENCOUNTER — Encounter

## 2024-11-01 ENCOUNTER — Ambulatory Visit: Admitting: Dermatology
